# Patient Record
Sex: Female | Born: 1935 | Race: White | Hispanic: No | Marital: Married | Smoking: Never smoker
Health system: Southern US, Community
[De-identification: ages and names within clinical notes are randomized; demographics above are authoritative.]

## PROBLEM LIST (undated history)

## (undated) DIAGNOSIS — C801 Malignant (primary) neoplasm, unspecified: Secondary | ICD-10-CM

## (undated) HISTORY — PX: TUBAL LIGATION: SHX77

## (undated) HISTORY — PX: BLADDER SURGERY: SHX569

## (undated) HISTORY — PX: BREAST SURGERY: SHX581

## (undated) HISTORY — PX: APPENDECTOMY: SHX54

## (undated) HISTORY — PX: CHOLECYSTECTOMY: SHX55

---

## 2010-11-22 ENCOUNTER — Emergency Department (HOSPITAL_BASED_OUTPATIENT_CLINIC_OR_DEPARTMENT_OTHER)
Admission: EM | Admit: 2010-11-22 | Discharge: 2010-11-22 | Payer: Self-pay | Source: Home / Self Care | Admitting: Emergency Medicine

## 2013-06-01 ENCOUNTER — Ambulatory Visit: Payer: Medicare Other | Attending: Oncology | Admitting: Physical Therapy

## 2013-06-01 DIAGNOSIS — Z79899 Other long term (current) drug therapy: Secondary | ICD-10-CM | POA: Insufficient documentation

## 2013-06-01 DIAGNOSIS — C50919 Malignant neoplasm of unspecified site of unspecified female breast: Secondary | ICD-10-CM | POA: Insufficient documentation

## 2013-06-01 DIAGNOSIS — I890 Lymphedema, not elsewhere classified: Secondary | ICD-10-CM | POA: Insufficient documentation

## 2013-06-01 DIAGNOSIS — IMO0001 Reserved for inherently not codable concepts without codable children: Secondary | ICD-10-CM | POA: Insufficient documentation

## 2013-06-01 DIAGNOSIS — Z9010 Acquired absence of unspecified breast and nipple: Secondary | ICD-10-CM | POA: Insufficient documentation

## 2013-06-03 ENCOUNTER — Ambulatory Visit: Payer: Medicare Other

## 2013-06-06 ENCOUNTER — Ambulatory Visit: Payer: Medicare Other

## 2013-06-10 ENCOUNTER — Ambulatory Visit: Payer: Medicare Other

## 2018-02-05 ENCOUNTER — Emergency Department (HOSPITAL_BASED_OUTPATIENT_CLINIC_OR_DEPARTMENT_OTHER): Payer: Medicare Other

## 2018-02-05 ENCOUNTER — Encounter (HOSPITAL_BASED_OUTPATIENT_CLINIC_OR_DEPARTMENT_OTHER): Payer: Self-pay | Admitting: *Deleted

## 2018-02-05 ENCOUNTER — Other Ambulatory Visit: Payer: Self-pay

## 2018-02-05 ENCOUNTER — Emergency Department (HOSPITAL_BASED_OUTPATIENT_CLINIC_OR_DEPARTMENT_OTHER)
Admission: EM | Admit: 2018-02-05 | Discharge: 2018-02-05 | Disposition: A | Discharge status: Home / Self Care | Payer: Medicare Other | Attending: Emergency Medicine | Admitting: Emergency Medicine

## 2018-02-05 DIAGNOSIS — Y929 Unspecified place or not applicable: Secondary | ICD-10-CM | POA: Insufficient documentation

## 2018-02-05 DIAGNOSIS — Z79899 Other long term (current) drug therapy: Secondary | ICD-10-CM | POA: Insufficient documentation

## 2018-02-05 DIAGNOSIS — W19XXXA Unspecified fall, initial encounter: Secondary | ICD-10-CM

## 2018-02-05 DIAGNOSIS — Y9389 Activity, other specified: Secondary | ICD-10-CM | POA: Insufficient documentation

## 2018-02-05 DIAGNOSIS — Y998 Other external cause status: Secondary | ICD-10-CM | POA: Insufficient documentation

## 2018-02-05 DIAGNOSIS — Z859 Personal history of malignant neoplasm, unspecified: Secondary | ICD-10-CM | POA: Insufficient documentation

## 2018-02-05 DIAGNOSIS — Z9049 Acquired absence of other specified parts of digestive tract: Secondary | ICD-10-CM | POA: Insufficient documentation

## 2018-02-05 DIAGNOSIS — S0990XA Unspecified injury of head, initial encounter: Secondary | ICD-10-CM | POA: Insufficient documentation

## 2018-02-05 DIAGNOSIS — W01198A Fall on same level from slipping, tripping and stumbling with subsequent striking against other object, initial encounter: Secondary | ICD-10-CM | POA: Diagnosis not present

## 2018-02-05 HISTORY — DX: Malignant (primary) neoplasm, unspecified: C80.1

## 2018-02-05 MED ORDER — TETANUS-DIPHTH-ACELL PERTUSSIS 5-2.5-18.5 LF-MCG/0.5 IM SUSP: 0.5000 mL | Freq: Once | INTRAMUSCULAR | Status: DC

## 2018-02-05 MED ORDER — ACETAMINOPHEN 500 MG PO TABS: 1000.0000 mg | ORAL_TABLET | Freq: Once | ORAL | Status: DC

## 2018-02-05 NOTE — ED Triage Notes (Signed)
She was giving her dog a bath in the tub and when she stood up she fell backward before getting up. She hit the back of her head on a ceramic wall. No LOC. Large hematoma to the back of her head. She is alert and oriented.

## 2018-02-05 NOTE — ED Notes (Signed)
Patient transported to CT 

## 2018-02-05 NOTE — ED Provider Notes (Addendum)
[facility] DEPART[facility] Provider Note   CSN: [id number] Arrival date & time: 02/05/18  1647     History   Chief Complaint Chief Complaint  Patient presents with  . Fall    HPI Kim Hunt is a 84 y.o. female presenting for evaluation after a fall.   Pt states she was washing her dog when she lost her balance and fell backwards, hitting the back of her head on a cement wall. She denies LOC. No neck or back pain. denies vision changes, slurred speech, decreased concentration, N/V. Denies CP, SOB, abd pain, loss of bowel or bladder control, or numbness or tingling. She is not on blood thinners. She has ambulated without difficulty since the fall. She used ice and advil PTA, did not change her sxs. Was evaluated at urgent care and sent to ER for further evaluation.    HPI  Past Medical History:  Diagnosis Date  . Cancer ([place])     There are no active problems to display for this patient.   Past Surgical History:  Procedure Laterality Date  . APPENDECTOMY    . BLADDER SURGERY    . BREAST SURGERY    . CHOLECYSTECTOMY    . TUBAL LIGATION       OB History   None      Home Medications    Prior to Admission medications   Medication Sig Start Date End Date Taking? Authorizing Provider  anastrozole (ARIMIDEX) 1 MG tablet Take 1 mg by mouth daily.   Yes [provider]  GABAPENTIN PO Take by mouth.   Yes [provider]  VERAPAMIL HCL PO Take by mouth.   Yes [provider]    Family History No family history on file.  Social History Social History   Tobacco Use  . Smoking status: Never Smoker  . Smokeless tobacco: Never Used  Substance Use Topics  . Alcohol use: Never    Frequency: Never  . Drug use: Never     Allergies   Relafen [nabumetone] and Sulfa antibiotics   Review of Systems Review of Systems  HENT:       Occipital head injury  Eyes: Negative for visual disturbance.  Respiratory: Negative  for shortness of breath.   Cardiovascular: Negative for chest pain.  Gastrointestinal: Negative for abdominal pain, nausea and vomiting.  Genitourinary:       No loss of bowel or bladder  Musculoskeletal: Negative for back pain and neck pain.  Skin: Positive for wound.  Neurological: Negative for dizziness, light-headedness and numbness.  Hematological: Does not bruise/bleed easily.  Psychiatric/Behavioral: Negative for confusion.     Physical Exam Updated Vital Signs BP 133/60   Pulse 66   Temp 97.9 F (36.6 C) (Oral)   Resp 16   Ht 5\' 2"  (1.575 m)   Wt 77.1 kg (170 lb)   SpO2 97%   BMI 31.09 kg/m   Physical Exam  Constitutional: She is oriented to person, place, and time. She appears well-developed and well-nourished. No distress.  HENT:  Head: Normocephalic.  Right Ear: Tympanic membrane, external ear and ear canal normal.  Left Ear: Tympanic membrane, external ear and ear canal normal.  Nose: Nose normal.  Mouth/Throat: Uvula is midline, oropharynx is clear and moist and mucous membranes are normal.  Hematoma on occiput of head without bleeding. No malocclusion. No TTP elsewhere of the head or scalp.  Eyes: Pupils are equal, round, and reactive to light. EOM are normal.  Neck:  Normal range of motion. Neck supple.  Full ROM of head and neck without pain. No TTP of midline c-spine   Cardiovascular: Normal rate, regular rhythm and intact distal pulses.  Pulmonary/Chest: Effort normal and breath sounds normal. She exhibits no tenderness.  No TTP of the chest wall  Abdominal: Soft. She exhibits no distension. There is no tenderness.  No TTP of the abd.  Musculoskeletal: She exhibits no tenderness.  No TTP of the back or extremities. Strength intact x4, sensation intact x4. Radial and pedal pulses equal bilaterally. Pt ambulatory without difficulty.   Neurological: She is alert and oriented to person, place, and time. She has normal strength. No cranial nerve deficit or  sensory deficit. GCS eye subscore is 4. GCS verbal subscore is 5. GCS motor subscore is 6.  Fine movement and coordination intact. No obvious neuro deficit  Skin: Skin is warm.  Psychiatric: She has a normal mood and affect.  Nursing note and vitals reviewed.    ED Treatments / Results  Labs (all labs ordered are listed, but only abnormal results are displayed) Labs Reviewed - No data to display  EKG None  Radiology Ct Head Wo Contrast  Result Date: 02/05/2018 CLINICAL DATA:  Patient fell backwards hitting posterior head on wall. EXAM: CT HEAD WITHOUT CONTRAST CT CERVICAL SPINE WITHOUT CONTRAST TECHNIQUE: Multidetector CT imaging of the head and cervical spine was performed following the standard protocol without intravenous contrast. Multiplanar CT image reconstructions of the cervical spine were also generated. COMPARISON:  None. Head CT 11/22/2010, radiograph report of the cervical spine from 08/29/1995 FINDINGS: CT HEAD FINDINGS Brain: Atrophy with chronic small vessel ischemic disease. No acute intracranial hemorrhage, midline shift or edema. No hydrocephalus. Midline fourth ventricle and basal cisterns. No intra-axial mass nor extra-axial fluid collections. Vascular: No hyperdense vessel sign. Moderate atherosclerosis of the carotid siphons bilaterally. Skull: No acute skull fracture. Sinuses/Orbits: No acute finding. Other: Right posterior parietal scalp contusion. CT CERVICAL SPINE FINDINGS Alignment: Intact craniocervical relationship and atlantodental interval. Maintained cervical lordosis. Skull base and vertebrae: No acute fracture. No primary bone lesion or focal pathologic process. Osteoarthritis across the atlantodental interval with spurring. Osteoarthritis of the C4-5, C5-6 and C6-7 uncovertebral joints with minimal uncinate spurring. Soft tissues and spinal canal: No prevertebral fluid or swelling. No visible canal hematoma. Disc levels: Moderate disc space narrowing from C4  through T2 with small dorsal osteophytes from C4 through C7. No significant central canal stenosis or neural foraminal encroachment. Upper chest: Negative. Other: None IMPRESSION: 1. Right posterior parietal scalp contusion without underlying skull fracture. 2. Atrophy with chronic small vessel ischemia. No acute intracranial abnormality. 3. No acute cervical spine fracture or posttraumatic listhesis. Chronic degenerative disc disease C4 through T2. Electronically Signed   By: Ashley Royalty M.D.   On: 02/05/2018 18:10   Ct Cervical Spine Wo Contrast  Result Date: 02/05/2018 CLINICAL DATA:  Patient fell backwards hitting posterior head on wall. EXAM: CT HEAD WITHOUT CONTRAST CT CERVICAL SPINE WITHOUT CONTRAST TECHNIQUE: Multidetector CT imaging of the head and cervical spine was performed following the standard protocol without intravenous contrast. Multiplanar CT image reconstructions of the cervical spine were also generated. COMPARISON:  None. Head CT 11/22/2010, radiograph report of the cervical spine from 08/29/1995 FINDINGS: CT HEAD FINDINGS Brain: Atrophy with chronic small vessel ischemic disease. No acute intracranial hemorrhage, midline shift or edema. No hydrocephalus. Midline fourth ventricle and basal cisterns. No intra-axial mass nor extra-axial fluid collections. Vascular: No hyperdense vessel sign. Moderate atherosclerosis  of the carotid siphons bilaterally. Skull: No acute skull fracture. Sinuses/Orbits: No acute finding. Other: Right posterior parietal scalp contusion. CT CERVICAL SPINE FINDINGS Alignment: Intact craniocervical relationship and atlantodental interval. Maintained cervical lordosis. Skull base and vertebrae: No acute fracture. No primary bone lesion or focal pathologic process. Osteoarthritis across the atlantodental interval with spurring. Osteoarthritis of the C4-5, C5-6 and C6-7 uncovertebral joints with minimal uncinate spurring. Soft tissues and spinal canal: No prevertebral  fluid or swelling. No visible canal hematoma. Disc levels: Moderate disc space narrowing from C4 through T2 with small dorsal osteophytes from C4 through C7. No significant central canal stenosis or neural foraminal encroachment. Upper chest: Negative. Other: None IMPRESSION: 1. Right posterior parietal scalp contusion without underlying skull fracture. 2. Atrophy with chronic small vessel ischemia. No acute intracranial abnormality. 3. No acute cervical spine fracture or posttraumatic listhesis. Chronic degenerative disc disease C4 through T2. Electronically Signed   By: Ashley Royalty M.D.   On: 02/05/2018 18:10    Procedures Procedures (including critical care time)  Medications Ordered in ED Medications  acetaminophen (TYLENOL) tablet 1,000 mg (1,000 mg Oral Not Given 02/05/18 1727)     Initial Impression / Assessment and Plan / ED Course  I have reviewed the triage vital signs and the nursing notes.  Pertinent labs & imaging results that were available during my care of the patient were reviewed by me and considered in my medical decision making (see chart for details).     Pt presenting for evaluation after a fall. Head injury without bleeding or LOC. Physical exam shows no gross neurologic deficits. Will obtain ct head and neck for further eval.   Ct head and neck negative for bleed, fx, or other concerning acute pathology. Discussed findings with pt. Discussed tylenol, nsaid, and ice use for sx control. Follow up with pcp as needed. At this time, pt appears safe for d/c. Return precautions given. Pt states she understands and agrees to plan.   Final Clinical Impressions(s) / ED Diagnoses   Final diagnoses:  Fall, initial encounter  Injury of head, initial encounter    ED Discharge Orders    None       Franchot Heidelberg, PA-C 02/05/18 [place], Yuridiana Formanek, PA-C 02/05/18 [place], [place], DO 02/05/18 [id number]

## 2018-02-05 NOTE — Discharge Instructions (Signed)
Take tylenol and ibuprofen as needed for pain.  Continue to use ice on your head.  Follow up with your primary care doctor as needed.  Return to the ER if you develop vision changes, slurred speech, vomiting, imbalance, severe headache, or any new or concerning symptoms.

## 2018-02-06 ENCOUNTER — Emergency Department (HOSPITAL_BASED_OUTPATIENT_CLINIC_OR_DEPARTMENT_OTHER): Payer: Medicare Other

## 2018-02-06 ENCOUNTER — Other Ambulatory Visit: Payer: Self-pay

## 2018-02-06 ENCOUNTER — Observation Stay (HOSPITAL_BASED_OUTPATIENT_CLINIC_OR_DEPARTMENT_OTHER)
Admission: EM | Admit: 2018-02-06 | Discharge: 2018-02-07 | Disposition: A | Discharge status: Home / Self Care | Payer: Medicare Other | Attending: Internal Medicine | Admitting: Internal Medicine

## 2018-02-06 ENCOUNTER — Encounter (HOSPITAL_BASED_OUTPATIENT_CLINIC_OR_DEPARTMENT_OTHER): Payer: Self-pay | Admitting: Emergency Medicine

## 2018-02-06 DIAGNOSIS — S066X0A Traumatic subarachnoid hemorrhage without loss of consciousness, initial encounter: Secondary | ICD-10-CM | POA: Diagnosis not present

## 2018-02-06 DIAGNOSIS — S065XAA Traumatic subdural hemorrhage with loss of consciousness status unknown, initial encounter: Secondary | ICD-10-CM | POA: Diagnosis present

## 2018-02-06 DIAGNOSIS — Y9389 Activity, other specified: Secondary | ICD-10-CM | POA: Insufficient documentation

## 2018-02-06 DIAGNOSIS — S065X0A Traumatic subdural hemorrhage without loss of consciousness, initial encounter: Secondary | ICD-10-CM | POA: Diagnosis not present

## 2018-02-06 DIAGNOSIS — Y999 Unspecified external cause status: Secondary | ICD-10-CM | POA: Insufficient documentation

## 2018-02-06 DIAGNOSIS — S065X9A Traumatic subdural hemorrhage with loss of consciousness of unspecified duration, initial encounter: Secondary | ICD-10-CM | POA: Diagnosis present

## 2018-02-06 DIAGNOSIS — I10 Essential (primary) hypertension: Secondary | ICD-10-CM | POA: Insufficient documentation

## 2018-02-06 DIAGNOSIS — S098XXA Other specified injuries of head, initial encounter: Secondary | ICD-10-CM | POA: Diagnosis present

## 2018-02-06 DIAGNOSIS — Z853 Personal history of malignant neoplasm of breast: Secondary | ICD-10-CM | POA: Insufficient documentation

## 2018-02-06 DIAGNOSIS — W01198A Fall on same level from slipping, tripping and stumbling with subsequent striking against other object, initial encounter: Secondary | ICD-10-CM | POA: Insufficient documentation

## 2018-02-06 DIAGNOSIS — S0990XA Unspecified injury of head, initial encounter: Secondary | ICD-10-CM

## 2018-02-06 DIAGNOSIS — Z9181 History of falling: Secondary | ICD-10-CM | POA: Diagnosis not present

## 2018-02-06 DIAGNOSIS — Y92000 Kitchen of unspecified non-institutional (private) residence as  the place of occurrence of the external cause: Secondary | ICD-10-CM | POA: Insufficient documentation

## 2018-02-06 LAB — CBC WITH DIFFERENTIAL/PLATELET
BASOS ABS: 0 10*3/uL (ref 0.0–0.1)
BASOS PCT: 0 %
Eosinophils Absolute: 0.1 10*3/uL (ref 0.0–0.7)
Eosinophils Relative: 2 %
HEMATOCRIT: 37.2 % (ref 36.0–46.0)
HEMOGLOBIN: 12.4 g/dL (ref 12.0–15.0)
Lymphocytes Relative: 14 %
Lymphs Abs: 1 10*3/uL (ref 0.7–4.0)
MCH: 31.6 pg (ref 26.0–34.0)
MCHC: 33.3 g/dL (ref 30.0–36.0)
MCV: 94.9 fL (ref 78.0–100.0)
MONO ABS: 0.5 10*3/uL (ref 0.1–1.0)
Monocytes Relative: 6 %
NEUTROS PCT: 78 %
Neutro Abs: 6 10*3/uL (ref 1.7–7.7)
Platelets: 213 10*3/uL (ref 150–400)
RBC: 3.92 MIL/uL (ref 3.87–5.11)
RDW: 12.5 % (ref 11.5–15.5)
WBC: 7.7 10*3/uL (ref 4.0–10.5)

## 2018-02-06 LAB — BASIC METABOLIC PANEL
Anion gap: 9 (ref 5–15)
BUN: 19 mg/dL (ref 6–20)
CALCIUM: 9.4 mg/dL (ref 8.9–10.3)
CO2: 23 mmol/L (ref 22–32)
Chloride: 107 mmol/L (ref 101–111)
Creatinine, Ser: 0.8 mg/dL (ref 0.44–1.00)
Glucose, Bld: 93 mg/dL (ref 65–99)
Potassium: 4.1 mmol/L (ref 3.5–5.1)
Sodium: 139 mmol/L (ref 135–145)

## 2018-02-06 LAB — PROTIME-INR
INR: 1
PROTHROMBIN TIME: 13.1 s (ref 11.4–15.2)

## 2018-02-06 MED ORDER — ACETAMINOPHEN 325 MG PO TABS
650.0000 mg | ORAL_TABLET | ORAL | Status: DC | PRN
  Administered 2018-02-06 – 2018-02-07 (×2): 650 mg via ORAL

## 2018-02-06 MED ORDER — SENNOSIDES-DOCUSATE SODIUM 8.6-50 MG PO TABS
1.0000 | ORAL_TABLET | Freq: Two times a day (BID) | ORAL | Status: DC
  Administered 2018-02-06: 1 via ORAL
  Filled 2018-02-06: qty 1

## 2018-02-06 MED ORDER — PANTOPRAZOLE SODIUM 40 MG IV SOLR
40.0000 mg | Freq: Every day | INTRAVENOUS | Status: DC
  Administered 2018-02-06: 40 mg via INTRAVENOUS
  Filled 2018-02-06: qty 40

## 2018-02-06 MED ORDER — ACETAMINOPHEN 160 MG/5ML PO SOLN: 650.0000 mg | ORAL | Status: DC | PRN

## 2018-02-06 MED ORDER — METOPROLOL TARTRATE 5 MG/5ML IV SOLN: 5.0000 mg | INTRAVENOUS | Status: DC | PRN

## 2018-02-06 MED ORDER — ACETAMINOPHEN 325 MG PO TABS
650.0000 mg | ORAL_TABLET | ORAL | Status: DC | PRN
  Filled 2018-02-06 (×3): qty 2

## 2018-02-06 MED ORDER — ACETAMINOPHEN 650 MG RE SUPP: 650.0000 mg | RECTAL | Status: DC | PRN

## 2018-02-06 MED ORDER — ACETAMINOPHEN 500 MG PO TABS
1000.0000 mg | ORAL_TABLET | Freq: Once | ORAL | Status: AC
  Administered 2018-02-06: 1000 mg via ORAL
  Filled 2018-02-06: qty 2

## 2018-02-06 MED ORDER — STROKE: EARLY STAGES OF RECOVERY BOOK
Freq: Once | Status: AC
  Administered 2018-02-06: 22:00:00

## 2018-02-06 NOTE — ED Notes (Signed)
ED Provider at bedside. 

## 2018-02-06 NOTE — ED Notes (Signed)
Tetanus UTD

## 2018-02-06 NOTE — ED Triage Notes (Signed)
Pt reports falling this morning when trying to get up from the chair. Pt hit head on side of the cabinet. Bleeding noted to outside of R ear. Denies LOC. Pt also reports falling yesterday.

## 2018-02-06 NOTE — ED Notes (Signed)
Patient transported to CT 

## 2018-02-06 NOTE — Progress Notes (Signed)
Patient has arrived in her room via [facility] transport.

## 2018-02-06 NOTE — ED Provider Notes (Addendum)
[facility] Provider Note   CSN: [id number] Arrival date & time: 02/06/18  1153     History   Chief Complaint Chief Complaint  Patient presents with  . Fall    HPI Kim Hunt is a 84 y.o. female.  HPI She was standing on a step stool in her kitchen and lost her balance.  She fell hitting her right side of her head on the corner of her cabinet.  She denies that she got knocked out.  She did go down to the floor.  She apparently landed on her left side.  Patient reports that she was able to get up.  She has been ambulatory.  She denies neck pain.  She denies focal weakness numbness tingling.  She does report however she has a headache on the left side of her head.  She is not on any anticoagulants.  She was concerned because she actually fell day before yesterday while trying to wash her dog in the bathroom and struck her head.  She was seen in the emergency department and CT scan was done that was negative.  She did have posterior scalp hematoma. Past Medical History:  Diagnosis Date  . Cancer ([place])     There are no active problems to display for this patient.   Past Surgical History:  Procedure Laterality Date  . APPENDECTOMY    . BLADDER SURGERY    . BREAST SURGERY    . CHOLECYSTECTOMY    . TUBAL LIGATION       OB History   None      Home Medications    Prior to Admission medications   Medication Sig Start Date End Date Taking? Authorizing Provider  anastrozole (ARIMIDEX) 1 MG tablet Take 1 mg by mouth daily.    [provider]  GABAPENTIN PO Take by mouth.    [provider]  VERAPAMIL HCL PO Take by mouth.    [provider]    Family History No family history on file.  Social History Social History   Tobacco Use  . Smoking status: Never Smoker  . Smokeless tobacco: Never Used  Substance Use Topics  . Alcohol use: Never    Frequency: Never  . Drug use: Never     Allergies     Relafen [nabumetone] and Sulfa antibiotics   Review of Systems Review of Systems 10 Systems reviewed and are negative for acute change except as noted in the HPI.   Physical Exam Updated Vital Signs BP 123/68   Pulse 73   Temp 98 F (36.7 C)   Resp 18   Ht 5\' 2"  (1.575 m)   Wt 77.1 kg (170 lb)   SpO2 93%   BMI 31.09 kg/m   Physical Exam  Constitutional: She is oriented to person, place, and time. She appears well-developed and well-nourished. No distress.  HENT:  Head: Normocephalic and atraumatic.  Nose: Nose normal.  Mouth/Throat: Oropharynx is clear and moist.  Patient has a linear laceration to the pinna that remains closed.  Does have slight amount of bleeding.  This is approximately 3 mm with no gaping.  She does have some subtle bruising within the pinna.  The TM is normal.  No blood in the ear canal.  Patient does have hematoma from yesterday.  This is a posterior scalp approximately 4 cm.  No bleeding or overlying laceration.  She reports there was a slight amount of bleeding yesterday.  Today this is clean  and dry with no active skin wound.  Eyes: Pupils are equal, round, and reactive to light. Conjunctivae and EOM are normal.  Neck: Neck supple.  No C-spine tenderness.  Cardiovascular: Normal rate and regular rhythm.  Murmur heard. Pulmonary/Chest: Effort normal and breath sounds normal. No respiratory distress. She exhibits no tenderness.  Abdominal: Soft. She exhibits no distension. There is no tenderness. There is no guarding.  Musculoskeletal: Normal range of motion. She exhibits no edema or tenderness.  A 4 cm bruise to the right anterior shin.  Normal range of motion of both extremities.  Normal range of motion of upper extremities.  No deformities.  Neurological: She is alert and oriented to person, place, and time. No cranial nerve deficit or sensory deficit. She exhibits normal muscle tone. Coordination normal.  Skin: Skin is warm and dry.  Psychiatric:  She has a normal mood and affect.  Nursing note and vitals reviewed.    ED Treatments / Results  Labs (all labs ordered are listed, but only abnormal results are displayed) Labs Reviewed  BASIC METABOLIC PANEL  CBC WITH DIFFERENTIAL/PLATELET  PROTIME-INR    EKG None  Radiology Ct Head Wo Contrast  Result Date: 02/06/2018 CLINICAL DATA:  [place] yesterday striking back of head, fell again today while climbing on a stool, slipped and struck RIGHT side of head on counter, RIGHT ear laceration, neck pain radiating to LEFT shoulder and arm, denies loss of consciousness, having headache and some dizziness EXAM: CT HEAD WITHOUT CONTRAST CT CERVICAL SPINE WITHOUT CONTRAST TECHNIQUE: Multidetector CT imaging of the head and cervical spine was performed following the standard protocol without intravenous contrast. Multiplanar CT image reconstructions of the cervical spine were also generated. Sagittal and coronal MPR images reconstructed from axial data set. COMPARISON:  02/05/2018 FINDINGS: CT HEAD FINDINGS Brain: Generalized atrophy. Normal ventricular morphology. No midline shift or mass effect. Minimal small vessel chronic ischemic changes of deep cerebral white matter. New small focus of subarachnoid hemorrhage in the LEFT frontal region 9 mm diameter. Probable small amount of subarachnoid hemorrhage in the LEFT sylvian fissure. Tiny subdural hematoma along the LEFT lateral aspect of the anterior falx 1-2 mm thick. No additional intracranial hemorrhage, mass lesion or evidence of acute infarction. Vascular: No hyperdense vessels. Atherosclerotic calcifications of internal carotid and vertebral arteries at skull base Skull: Posterior scalp hematoma. Bones demineralized. No calvarial fractures. Mild hyperostosis frontalis interna on RIGHT. Sinuses/Orbits: Clear Other: N/A CT CERVICAL SPINE FINDINGS Alignment: Normal Skull base and vertebrae: Osseous demineralization. Visualized skull base intact. Mild  scattered facet degenerative changes of the cervical spine. Scattered disc space narrowing and endplate spur formation at C4-C5 to T1-T2. Vertebral body heights maintained. No acute fracture, subluxation, or bone destruction. Soft tissues and spinal canal: Prevertebral soft tissues normal thickness. Regional cervical soft tissues unremarkable. Disc levels:  No additional significant findings Upper chest: Visualized lung apices clear. Other: N/A IMPRESSION: Tiny acute subdural hematoma along the LEFT lateral aspect of the anterior falx 1-2 mm thick. Small focus of acute subarachnoid hemorrhage at LEFT frontal region. Probable small amount of subarachnoid blood within the LEFT sylvian fissure. No acute parenchymal brain abnormalities. Atrophy with small vessel chronic ischemic changes of deep cerebral white matter. Degenerative disc and facet disease changes of the cervical spine. No acute cervical spine abnormalities. Critical Value/emergent results were called by telephone at the time of interpretation on 02/06/2018 at 2:01 pm to Dr. Charlesetta Shanks , who verbally acknowledged these results. Electronically Signed   By: Elta [place]  Thornton Papas M.D.   On: 02/06/2018 14:03   Ct Head Wo Contrast  Result Date: 02/05/2018 CLINICAL DATA:  Patient fell backwards hitting posterior head on wall. EXAM: CT HEAD WITHOUT CONTRAST CT CERVICAL SPINE WITHOUT CONTRAST TECHNIQUE: Multidetector CT imaging of the head and cervical spine was performed following the standard protocol without intravenous contrast. Multiplanar CT image reconstructions of the cervical spine were also generated. COMPARISON:  None. Head CT 11/22/2010, radiograph report of the cervical spine from 08/29/1995 FINDINGS: CT HEAD FINDINGS Brain: Atrophy with chronic small vessel ischemic disease. No acute intracranial hemorrhage, midline shift or edema. No hydrocephalus. Midline fourth ventricle and basal cisterns. No intra-axial mass nor extra-axial fluid collections.  Vascular: No hyperdense vessel sign. Moderate atherosclerosis of the carotid siphons bilaterally. Skull: No acute skull fracture. Sinuses/Orbits: No acute finding. Other: Right posterior parietal scalp contusion. CT CERVICAL SPINE FINDINGS Alignment: Intact craniocervical relationship and atlantodental interval. Maintained cervical lordosis. Skull base and vertebrae: No acute fracture. No primary bone lesion or focal pathologic process. Osteoarthritis across the atlantodental interval with spurring. Osteoarthritis of the C4-5, C5-6 and C6-7 uncovertebral joints with minimal uncinate spurring. Soft tissues and spinal canal: No prevertebral fluid or swelling. No visible canal hematoma. Disc levels: Moderate disc space narrowing from C4 through T2 with small dorsal osteophytes from C4 through C7. No significant central canal stenosis or neural foraminal encroachment. Upper chest: Negative. Other: None IMPRESSION: 1. Right posterior parietal scalp contusion without underlying skull fracture. 2. Atrophy with chronic small vessel ischemia. No acute intracranial abnormality. 3. No acute cervical spine fracture or posttraumatic listhesis. Chronic degenerative disc disease C4 through T2. Electronically Signed   By: Ashley Royalty M.D.   On: 02/05/2018 18:10   Ct Cervical Spine Wo Contrast  Result Date: 02/06/2018 CLINICAL DATA:  [place] yesterday striking back of head, fell again today while climbing on a stool, slipped and struck RIGHT side of head on counter, RIGHT ear laceration, neck pain radiating to LEFT shoulder and arm, denies loss of consciousness, having headache and some dizziness EXAM: CT HEAD WITHOUT CONTRAST CT CERVICAL SPINE WITHOUT CONTRAST TECHNIQUE: Multidetector CT imaging of the head and cervical spine was performed following the standard protocol without intravenous contrast. Multiplanar CT image reconstructions of the cervical spine were also generated. Sagittal and coronal MPR images reconstructed from  axial data set. COMPARISON:  02/05/2018 FINDINGS: CT HEAD FINDINGS Brain: Generalized atrophy. Normal ventricular morphology. No midline shift or mass effect. Minimal small vessel chronic ischemic changes of deep cerebral white matter. New small focus of subarachnoid hemorrhage in the LEFT frontal region 9 mm diameter. Probable small amount of subarachnoid hemorrhage in the LEFT sylvian fissure. Tiny subdural hematoma along the LEFT lateral aspect of the anterior falx 1-2 mm thick. No additional intracranial hemorrhage, mass lesion or evidence of acute infarction. Vascular: No hyperdense vessels. Atherosclerotic calcifications of internal carotid and vertebral arteries at skull base Skull: Posterior scalp hematoma. Bones demineralized. No calvarial fractures. Mild hyperostosis frontalis interna on RIGHT. Sinuses/Orbits: Clear Other: N/A CT CERVICAL SPINE FINDINGS Alignment: Normal Skull base and vertebrae: Osseous demineralization. Visualized skull base intact. Mild scattered facet degenerative changes of the cervical spine. Scattered disc space narrowing and endplate spur formation at C4-C5 to T1-T2. Vertebral body heights maintained. No acute fracture, subluxation, or bone destruction. Soft tissues and spinal canal: Prevertebral soft tissues normal thickness. Regional cervical soft tissues unremarkable. Disc levels:  No additional significant findings Upper chest: Visualized lung apices clear. Other: N/A IMPRESSION: Tiny acute subdural hematoma along the  LEFT lateral aspect of the anterior falx 1-2 mm thick. Small focus of acute subarachnoid hemorrhage at LEFT frontal region. Probable small amount of subarachnoid blood within the LEFT sylvian fissure. No acute parenchymal brain abnormalities. Atrophy with small vessel chronic ischemic changes of deep cerebral white matter. Degenerative disc and facet disease changes of the cervical spine. No acute cervical spine abnormalities. Critical Value/emergent results were  called by telephone at the time of interpretation on 02/06/2018 at 2:01 pm to Dr. Charlesetta Shanks , who verbally acknowledged these results. Electronically Signed   By: Lavonia Dana M.D.   On: 02/06/2018 14:03   Ct Cervical Spine Wo Contrast  Result Date: 02/05/2018 CLINICAL DATA:  Patient fell backwards hitting posterior head on wall. EXAM: CT HEAD WITHOUT CONTRAST CT CERVICAL SPINE WITHOUT CONTRAST TECHNIQUE: Multidetector CT imaging of the head and cervical spine was performed following the standard protocol without intravenous contrast. Multiplanar CT image reconstructions of the cervical spine were also generated. COMPARISON:  None. Head CT 11/22/2010, radiograph report of the cervical spine from 08/29/1995 FINDINGS: CT HEAD FINDINGS Brain: Atrophy with chronic small vessel ischemic disease. No acute intracranial hemorrhage, midline shift or edema. No hydrocephalus. Midline fourth ventricle and basal cisterns. No intra-axial mass nor extra-axial fluid collections. Vascular: No hyperdense vessel sign. Moderate atherosclerosis of the carotid siphons bilaterally. Skull: No acute skull fracture. Sinuses/Orbits: No acute finding. Other: Right posterior parietal scalp contusion. CT CERVICAL SPINE FINDINGS Alignment: Intact craniocervical relationship and atlantodental interval. Maintained cervical lordosis. Skull base and vertebrae: No acute fracture. No primary bone lesion or focal pathologic process. Osteoarthritis across the atlantodental interval with spurring. Osteoarthritis of the C4-5, C5-6 and C6-7 uncovertebral joints with minimal uncinate spurring. Soft tissues and spinal canal: No prevertebral fluid or swelling. No visible canal hematoma. Disc levels: Moderate disc space narrowing from C4 through T2 with small dorsal osteophytes from C4 through C7. No significant central canal stenosis or neural foraminal encroachment. Upper chest: Negative. Other: None IMPRESSION: 1. Right posterior parietal scalp  contusion without underlying skull fracture. 2. Atrophy with chronic small vessel ischemia. No acute intracranial abnormality. 3. No acute cervical spine fracture or posttraumatic listhesis. Chronic degenerative disc disease C4 through T2. Electronically Signed   By: Ashley Royalty M.D.   On: 02/05/2018 18:10    Procedures Procedures (including critical care time)  Medications Ordered in ED Medications  acetaminophen (TYLENOL) tablet 1,000 mg (1,000 mg Oral Given 02/06/18 1323)     Initial Impression / Assessment and Plan / ED Course  I have reviewed the triage vital signs and the nursing notes.  Pertinent labs & imaging results that were available during my care of the patient were reviewed by me and considered in my medical decision making (see chart for details).    Consult: Discussed with Dr. Christella Noa.  Will admit to hospitalist service for observation.  Patient's management at this time is nonoperative. Consult: [facility] hospitalist Dr. Lonn Georgia for admission.  Final Clinical Impressions(s) / ED Diagnoses   Final diagnoses:  Injury of head, initial encounter  Subdural hematoma (H[facility])  Subarachnoid hematoma, without loss of consciousness, initial encounter [facility])   Patient had mechanical fall today.  Her clinical appearance is very good.  She is alert and appropriate.  Mental status is normal.  She did have left-sided headache.  CT confirms a very small subarachnoid and sub-dural hemorrhages.  I did review this with Dr. Christella Noa.  Findings are nonoperative but does agree with an observation.  Serial neuro testing and repeat  CT if any deterioration in patient's condition.  She also had a very minor laceration to the ear pinna with slight ecchymosis suggestive of early pinna hematoma.  There is no significant swelling that was suggest need to drain.  Will apply a pressure dressing.  Arrangements have been made for admission.  Patient does request attempt to admit to [facility] will  be made to determine if there is bed availability.  16:18 viewed with Dr. Doyne Keel at [facility].  He reports there are no beds available and they also do not have continupus neurosurgery coverage for a traumatic injury. ED Discharge Orders    None       Charlesetta Shanks, MD 02/06/18 1612    Charlesetta Shanks, MD 02/06/18 [phone]

## 2018-02-06 NOTE — ED Notes (Signed)
MD at bedside explaining results to patient.

## 2018-02-06 NOTE — Progress Notes (Signed)
Patient ID: Kim Hunt, female   DOB: 04-29-1931, 84 y.o.   MRN: [id number] Films are reviewed. There is no operative indication. There is a miniscule amount of blood on these films, there is no skull fracture, basal cisterns are widely patent.  No repeat film necessary unless her neurological exam declines.

## 2018-02-06 NOTE — H&P (Signed)
PCP:   [facility]   Chief Complaint:  fall  HPI: [name] who presents after 2 falls in 2 days,  Both times she hit her head..  They were mechanical falls. Yesterdays fall occurred after she was bathing her dog. She's  had 2 prior knee surgeries, her knee gave out and she failed to hold on to the bath tub. She fell backwards and hit the right side of her head. She went to the ER, w/u including CT head was negative and she was sent home.  This AM she was climbing on a chair to get something off the top of her refrigerator. She fell and hit the right side of her face. She had head and neck ain. She returned to the ER where repeat Ct head revealed 2 small SDH. She was transferred to [facility] for observation. The patient is not on any blood thinners. She did take an aduil yesterday for pain. Here she is alert and oriented. She was able to provide all history. She is accompanied by her daughter and husband at bedside. She denies dizziness, nausea, vomiting or confusion.   Review of Systems:  The patient denies anorexia, fever, weight loss,, vision loss, decreased hearing, hoarseness, chest pain, syncope, dyspnea on exertion, peripheral edema, balance deficits, headache, fall, hemoptysis, abdominal pain, melena, hematochezia, severe indigestion/heartburn, hematuria, incontinence, genital sores, muscle weakness, suspicious skin lesions, transient blindness, difficulty walking, depression, unusual weight change, abnormal bleeding, enlarged lymph nodes, angioedema, and breast masses.  Past Medical History: Past Medical History:  Diagnosis Date  . Cancer [facility])    Past Surgical History:  Procedure Laterality Date  . APPENDECTOMY    . BLADDER SURGERY    . BREAST SURGERY    . CHOLECYSTECTOMY    . TUBAL LIGATION      Medications: Prior to Admission medications   Medication Sig Start Date End Date Taking? Authorizing Provider  anastrozole (ARIMIDEX) 1 MG tablet Take 1 mg by mouth  at bedtime.    Yes [provider]  ciclopirox (PENLAC) 8 % solution Apply 1 application topically See admin instructions. Apply over toe nail and surrounding skin. Apply daily over previous coat. After seven (7) days,  remove with nail polish remover and continue cycle.   Yes [provider]  fluticasone (FLONASE) 50 MCG/ACT nasal spray Place 1 spray into both nostrils 2 (two) times daily as needed for allergies or rhinitis.   Yes [provider]  gabapentin (NEURONTIN) 300 MG capsule Take 300 mg by mouth 2 (two) times daily as needed (pain).   Yes [provider]  ibuprofen (ADVIL,MOTRIN) 200 MG tablet Take 200 mg by mouth every 6 (six) hours as needed for headache (pain).   Yes [provider]  verapamil (CALAN-SR) 240 MG CR tablet Take 240 mg by mouth daily.   Yes [provider]  Vitamin D, Ergocalciferol, (DRISDOL) 50000 units CAPS capsule Take 50,000 Units by mouth every Sunday.   Yes [provider]    Allergies:   Allergies  Allergen Reactions  . Clarithromycin Other (See Comments)    Unknown reaction  . Relafen [Nabumetone] Other (See Comments)    Unknown reaction  . Sulfa Antibiotics Nausea And Vomiting    Social History:  reports that she has never smoked. She has never used smokeless tobacco. She reports that she does not drink alcohol or use drugs.  Family History: Cancer - dad  Physical Exam: Vitals:   02/06/18 1700 02/06/18  1715 02/06/18 1730 02/06/18 1851  BP: 127/65  134/67 (!) 148/67  Pulse: 73 75 74 80  Resp: 16 17 15 16   Temp:    98.2 F (36.8 C)  TempSrc:    Oral  SpO2: 96% 98% 98% 100%  Weight:      Height:        General:  Alert and oriented times three, well developed and nourished, no acute distress Eyes: PERRLA, pink conjunctiva, no scleral icterus ENT: Moist oral mucosa, neck supple, no thyromegaly Lungs: clear to ascultation, no wheeze, no crackles, no use of accessory  muscles Cardiovascular: regular rate and rhythm, no regurgitation, no gallops, no murmurs. No carotid bruits, no JVD Abdomen: soft, positive BS, non-tender, non-distended, no organomegaly, not an acute abdomen GU: not examined Neuro: CN II - XII grossly intact, sensation intact Musculoskeletal: strength 5/5 all extremities, no clubbing, cyanosis or edema Skin: no rash, no subcutaneous crepitation, no decubitus Psych: appropriate patient   Labs on Admission:  Recent Labs    02/06/18 1423  NA 139  K 4.1  CL 107  CO2 23  GLUCOSE 93  BUN 19  CREATININE 0.80  CALCIUM 9.4   No results for input(s): AST, ALT, ALKPHOS, BILITOT, PROT, ALBUMIN in the last 72 hours. No results for input(s): LIPASE, AMYLASE in the last 72 hours. Recent Labs    02/06/18 1423  WBC 7.7  NEUTROABS 6.0  HGB 12.4  HCT 37.2  MCV 94.9  PLT 213   No results for input(s): CKTOTAL, CKMB, CKMBINDEX, TROPONINI in the last 72 hours. Invalid input(s): POCBNP No results for input(s): DDIMER in the last 72 hours. No results for input(s): HGBA1C in the last 72 hours. No results for input(s): CHOL, HDL, LDLCALC, TRIG, CHOLHDL, LDLDIRECT in the last 72 hours. No results for input(s): TSH, T4TOTAL, T3FREE, THYROIDAB in the last 72 hours.  Invalid input(s): FREET3 No results for input(s): VITAMINB12, FOLATE, FERRITIN, TIBC, IRON, RETICCTPCT in the last 72 hours.  Micro Results: No results found for this or any previous visit (from the past 240 hour(s)).   Radiological Exams on Admission: Ct Head Wo Contrast  Result Date: 02/06/2018 CLINICAL DATA:  [place] yesterday striking back of head, fell again today while climbing on a stool, slipped and struck RIGHT side of head on counter, RIGHT ear laceration, neck pain radiating to LEFT shoulder and arm, denies loss of consciousness, having headache and some dizziness EXAM: CT HEAD WITHOUT CONTRAST CT CERVICAL SPINE WITHOUT CONTRAST TECHNIQUE: Multidetector CT imaging of the  head and cervical spine was performed following the standard protocol without intravenous contrast. Multiplanar CT image reconstructions of the cervical spine were also generated. Sagittal and coronal MPR images reconstructed from axial data set. COMPARISON:  02/05/2018 FINDINGS: CT HEAD FINDINGS Brain: Generalized atrophy. Normal ventricular morphology. No midline shift or mass effect. Minimal small vessel chronic ischemic changes of deep cerebral white matter. New small focus of subarachnoid hemorrhage in the LEFT frontal region 9 mm diameter. Probable small amount of subarachnoid hemorrhage in the LEFT sylvian fissure. Tiny subdural hematoma along the LEFT lateral aspect of the anterior falx 1-2 mm thick. No additional intracranial hemorrhage, mass lesion or evidence of acute infarction. Vascular: No hyperdense vessels. Atherosclerotic calcifications of internal carotid and vertebral arteries at skull base Skull: Posterior scalp hematoma. Bones demineralized. No calvarial fractures. Mild hyperostosis frontalis interna on RIGHT. Sinuses/Orbits: Clear Other: N/A CT CERVICAL SPINE FINDINGS Alignment: Normal Skull base and vertebrae: Osseous demineralization. Visualized skull base intact. Mild scattered  facet degenerative changes of the cervical spine. Scattered disc space narrowing and endplate spur formation at C4-C5 to T1-T2. Vertebral body heights maintained. No acute fracture, subluxation, or bone destruction. Soft tissues and spinal canal: Prevertebral soft tissues normal thickness. Regional cervical soft tissues unremarkable. Disc levels:  No additional significant findings Upper chest: Visualized lung apices clear. Other: N/A IMPRESSION: Tiny acute subdural hematoma along the LEFT lateral aspect of the anterior falx 1-2 mm thick. Small focus of acute subarachnoid hemorrhage at LEFT frontal region. Probable small amount of subarachnoid blood within the LEFT sylvian fissure. No acute parenchymal brain  abnormalities. Atrophy with small vessel chronic ischemic changes of deep cerebral white matter. Degenerative disc and facet disease changes of the cervical spine. No acute cervical spine abnormalities. Critical Value/emergent results were called by telephone at the time of interpretation on 02/06/2018 at 2:01 pm to Dr. Charlesetta Shanks , who verbally acknowledged these results. Electronically Signed   By: Lavonia Dana M.D.   On: 02/06/2018 14:03   Ct Head Wo Contrast  Result Date: 02/05/2018 CLINICAL DATA:  Patient fell backwards hitting posterior head on wall. EXAM: CT HEAD WITHOUT CONTRAST CT CERVICAL SPINE WITHOUT CONTRAST TECHNIQUE: Multidetector CT imaging of the head and cervical spine was performed following the standard protocol without intravenous contrast. Multiplanar CT image reconstructions of the cervical spine were also generated. COMPARISON:  None. Head CT 11/22/2010, radiograph report of the cervical spine from 08/29/1995 FINDINGS: CT HEAD FINDINGS Brain: Atrophy with chronic small vessel ischemic disease. No acute intracranial hemorrhage, midline shift or edema. No hydrocephalus. Midline fourth ventricle and basal cisterns. No intra-axial mass nor extra-axial fluid collections. Vascular: No hyperdense vessel sign. Moderate atherosclerosis of the carotid siphons bilaterally. Skull: No acute skull fracture. Sinuses/Orbits: No acute finding. Other: Right posterior parietal scalp contusion. CT CERVICAL SPINE FINDINGS Alignment: Intact craniocervical relationship and atlantodental interval. Maintained cervical lordosis. Skull base and vertebrae: No acute fracture. No primary bone lesion or focal pathologic process. Osteoarthritis across the atlantodental interval with spurring. Osteoarthritis of the C4-5, C5-6 and C6-7 uncovertebral joints with minimal uncinate spurring. Soft tissues and spinal canal: No prevertebral fluid or swelling. No visible canal hematoma. Disc levels: Moderate disc space  narrowing from C4 through T2 with small dorsal osteophytes from C4 through C7. No significant central canal stenosis or neural foraminal encroachment. Upper chest: Negative. Other: None IMPRESSION: 1. Right posterior parietal scalp contusion without underlying skull fracture. 2. Atrophy with chronic small vessel ischemia. No acute intracranial abnormality. 3. No acute cervical spine fracture or posttraumatic listhesis. Chronic degenerative disc disease C4 through T2. Electronically Signed   By: Ashley Royalty M.D.   On: 02/05/2018 18:10   Ct Cervical Spine Wo Contrast  Result Date: 02/06/2018 CLINICAL DATA:  [place] yesterday striking back of head, fell again today while climbing on a stool, slipped and struck RIGHT side of head on counter, RIGHT ear laceration, neck pain radiating to LEFT shoulder and arm, denies loss of consciousness, having headache and some dizziness EXAM: CT HEAD WITHOUT CONTRAST CT CERVICAL SPINE WITHOUT CONTRAST TECHNIQUE: Multidetector CT imaging of the head and cervical spine was performed following the standard protocol without intravenous contrast. Multiplanar CT image reconstructions of the cervical spine were also generated. Sagittal and coronal MPR images reconstructed from axial data set. COMPARISON:  02/05/2018 FINDINGS: CT HEAD FINDINGS Brain: Generalized atrophy. Normal ventricular morphology. No midline shift or mass effect. Minimal small vessel chronic ischemic changes of deep cerebral white matter. New small focus of subarachnoid hemorrhage  in the LEFT frontal region 9 mm diameter. Probable small amount of subarachnoid hemorrhage in the LEFT sylvian fissure. Tiny subdural hematoma along the LEFT lateral aspect of the anterior falx 1-2 mm thick. No additional intracranial hemorrhage, mass lesion or evidence of acute infarction. Vascular: No hyperdense vessels. Atherosclerotic calcifications of internal carotid and vertebral arteries at skull base Skull: Posterior scalp hematoma.  Bones demineralized. No calvarial fractures. Mild hyperostosis frontalis interna on RIGHT. Sinuses/Orbits: Clear Other: N/A CT CERVICAL SPINE FINDINGS Alignment: Normal Skull base and vertebrae: Osseous demineralization. Visualized skull base intact. Mild scattered facet degenerative changes of the cervical spine. Scattered disc space narrowing and endplate spur formation at C4-C5 to T1-T2. Vertebral body heights maintained. No acute fracture, subluxation, or bone destruction. Soft tissues and spinal canal: Prevertebral soft tissues normal thickness. Regional cervical soft tissues unremarkable. Disc levels:  No additional significant findings Upper chest: Visualized lung apices clear. Other: N/A IMPRESSION: Tiny acute subdural hematoma along the LEFT lateral aspect of the anterior falx 1-2 mm thick. Small focus of acute subarachnoid hemorrhage at LEFT frontal region. Probable small amount of subarachnoid blood within the LEFT sylvian fissure. No acute parenchymal brain abnormalities. Atrophy with small vessel chronic ischemic changes of deep cerebral white matter. Degenerative disc and facet disease changes of the cervical spine. No acute cervical spine abnormalities. Critical Value/emergent results were called by telephone at the time of interpretation on 02/06/2018 at 2:01 pm to Dr. Charlesetta Shanks , who verbally acknowledged these results. Electronically Signed   By: Lavonia Dana M.D.   On: 02/06/2018 14:03   Ct Cervical Spine Wo Contrast  Result Date: 02/05/2018 CLINICAL DATA:  Patient fell backwards hitting posterior head on wall. EXAM: CT HEAD WITHOUT CONTRAST CT CERVICAL SPINE WITHOUT CONTRAST TECHNIQUE: Multidetector CT imaging of the head and cervical spine was performed following the standard protocol without intravenous contrast. Multiplanar CT image reconstructions of the cervical spine were also generated. COMPARISON:  None. Head CT 11/22/2010, radiograph report of the cervical spine from 08/29/1995  FINDINGS: CT HEAD FINDINGS Brain: Atrophy with chronic small vessel ischemic disease. No acute intracranial hemorrhage, midline shift or edema. No hydrocephalus. Midline fourth ventricle and basal cisterns. No intra-axial mass nor extra-axial fluid collections. Vascular: No hyperdense vessel sign. Moderate atherosclerosis of the carotid siphons bilaterally. Skull: No acute skull fracture. Sinuses/Orbits: No acute finding. Other: Right posterior parietal scalp contusion. CT CERVICAL SPINE FINDINGS Alignment: Intact craniocervical relationship and atlantodental interval. Maintained cervical lordosis. Skull base and vertebrae: No acute fracture. No primary bone lesion or focal pathologic process. Osteoarthritis across the atlantodental interval with spurring. Osteoarthritis of the C4-5, C5-6 and C6-7 uncovertebral joints with minimal uncinate spurring. Soft tissues and spinal canal: No prevertebral fluid or swelling. No visible canal hematoma. Disc levels: Moderate disc space narrowing from C4 through T2 with small dorsal osteophytes from C4 through C7. No significant central canal stenosis or neural foraminal encroachment. Upper chest: Negative. Other: None IMPRESSION: 1. Right posterior parietal scalp contusion without underlying skull fracture. 2. Atrophy with chronic small vessel ischemia. No acute intracranial abnormality. 3. No acute cervical spine fracture or posttraumatic listhesis. Chronic degenerative disc disease C4 through T2. Electronically Signed   By: Ashley Royalty M.D.   On: 02/05/2018 18:10    Assessment/Plan Present on Admission: . Subdural hematoma ([place]) -admit to med telemetry -Neurochecks -Blood pressure controlled -Neurology consult in a.m. -Repeat imaging in the a.m. -No anticoagulation ordered  Fall -Mechanical in nature -PT consulted  Kim Hunt 02/06/2018, 7:30 PM

## 2018-02-07 ENCOUNTER — Observation Stay (HOSPITAL_COMMUNITY): Payer: Medicare Other

## 2018-02-07 ENCOUNTER — Encounter (HOSPITAL_COMMUNITY): Payer: Self-pay | Admitting: Radiology

## 2018-02-07 DIAGNOSIS — S0990XA Unspecified injury of head, initial encounter: Secondary | ICD-10-CM

## 2018-02-07 DIAGNOSIS — R51 Headache: Secondary | ICD-10-CM | POA: Diagnosis not present

## 2018-02-07 DIAGNOSIS — S066X0A Traumatic subarachnoid hemorrhage without loss of consciousness, initial encounter: Secondary | ICD-10-CM | POA: Diagnosis not present

## 2018-02-07 DIAGNOSIS — S065X9A Traumatic subdural hemorrhage with loss of consciousness of unspecified duration, initial encounter: Secondary | ICD-10-CM | POA: Diagnosis not present

## 2018-02-07 LAB — CBC
HCT: 38.8 % (ref 36.0–46.0)
Hemoglobin: 12.5 g/dL (ref 12.0–15.0)
MCH: 31 pg (ref 26.0–34.0)
MCHC: 32.2 g/dL (ref 30.0–36.0)
MCV: 96.3 fL (ref 78.0–100.0)
Platelets: 230 10*3/uL (ref 150–400)
RBC: 4.03 MIL/uL (ref 3.87–5.11)
RDW: 13.2 % (ref 11.5–15.5)
WBC: 6 10*3/uL (ref 4.0–10.5)

## 2018-02-07 LAB — COMPREHENSIVE METABOLIC PANEL
ALBUMIN: 3.5 g/dL (ref 3.5–5.0)
ALT: 11 U/L — AB (ref 14–54)
ANION GAP: 12 (ref 5–15)
AST: 21 U/L (ref 15–41)
Alkaline Phosphatase: 79 U/L (ref 38–126)
BUN: 17 mg/dL (ref 6–20)
CALCIUM: 9.3 mg/dL (ref 8.9–10.3)
CO2: 22 mmol/L (ref 22–32)
Chloride: 106 mmol/L (ref 101–111)
Creatinine, Ser: 0.9 mg/dL (ref 0.44–1.00)
GFR calc Af Amer: >60 mL/min (ref >60)
GFR calc non Af Amer: 57 mL/min — ABNORMAL LOW (ref >60)
GLUCOSE: 61 mg/dL — AB (ref 65–99)
Potassium: 3.9 mmol/L (ref 3.5–5.1)
SODIUM: 140 mmol/L (ref 135–145)
Total Bilirubin: 0.8 mg/dL (ref 0.3–1.2)
Total Protein: 6.4 g/dL — ABNORMAL LOW (ref 6.5–8.1)

## 2018-02-07 LAB — LIPID PANEL
CHOL/HDL RATIO: 3.6 ratio
Cholesterol: 182 mg/dL (ref 0–200)
HDL: 50 mg/dL (ref >40)
LDL Cholesterol: 115 mg/dL — ABNORMAL HIGH (ref 0–99)
Triglycerides: 87 mg/dL (ref <150)
VLDL: 17 mg/dL (ref 0–40)

## 2018-02-07 LAB — PROTIME-INR
INR: 1.1
PROTHROMBIN TIME: 14.1 s (ref 11.4–15.2)

## 2018-02-07 MED ORDER — ONDANSETRON HCL 4 MG/2ML IJ SOLN
4.0000 mg | Freq: Four times a day (QID) | INTRAMUSCULAR | Status: DC | PRN
  Administered 2018-02-07: 4 mg via INTRAVENOUS
  Filled 2018-02-07: qty 2

## 2018-02-07 MED ORDER — TRAMADOL HCL 50 MG PO TABS
50.0000 mg | ORAL_TABLET | Freq: Four times a day (QID) | ORAL | Status: DC | PRN
  Administered 2018-02-07: 50 mg via ORAL
  Filled 2018-02-07: qty 1

## 2018-02-07 MED ORDER — ONDANSETRON HCL 4 MG PO TABS: 4.0000 mg | ORAL_TABLET | Freq: Three times a day (TID) | ORAL | 0 refills | Status: AC | PRN

## 2018-02-07 MED ORDER — TRAMADOL HCL 50 MG PO TABS: 50.0000 mg | ORAL_TABLET | Freq: Four times a day (QID) | ORAL | 0 refills | Status: AC | PRN

## 2018-02-07 NOTE — Discharge Instructions (Signed)
Subdural Hematoma °A subdural hematoma is a collection of blood between the brain and its tough outer covering (dura). As the amount of blood increases, it puts pressure on the brain. °There are two types of subdural hematomas: °· Acute. This type develops shortly after a hard, direct hit (blow) to the head and causes blood to collect very quickly. This is a medical emergency. If it is not diagnosed and treated quickly, it can lead to severe brain injury or death. °· Chronic. This is when bleeding develops more slowly, over weeks or months. ° °What are the causes? °This condition is caused by bleeding (hemorrhage) from a broken (ruptured) blood vessel. In most cases, a blood vessel ruptures and bleeds because of injury (trauma) to the head, such as from a hard, direct hit. Head trauma can happen in: °· Traffic accidents. °· Falls. °· Assaults. °· Sport injuries. ° °In rare cases, hemorrhage can happen without a known cause (spontaneously), especially if you take blood thinners (anticoagulants). °What increases the risk? °This condition is more likely to develop in: °· Older people. °· Infants. °· People who take blood thinners. °· People who have injured their head. °· People who abuse alcohol. ° °What are the signs or symptoms? °Depending on the size of the hematoma, symptoms can vary from mild to severe and life-threatening. Symptoms in acute subdural hematoma can develop over minutes or hours. Symptoms in chronic subdural hematoma may develop over weeks or months. °· Headaches. °· Nausea or vomiting. °· Changes in vision, such as double vision or loss of vision. °· Changes in speech. °· Loss of balance or difficulty walking. °· Weakness, numbness, or tingling in the arms or legs on one side of the body. °· Jerky movements that you cannot control (seizures). °· Change in personality. °· Increased sleepiness. °· Memory loss. °· Loss of consciousness. °· Coma. ° °How is this diagnosed? °This condition is diagnosed  based on the results of: °· A physical and neurological exam. °· CT scan. °· MRI. ° °How is this treated? °Treatment for this condition depends on the severity and the type of subdural hematoma that you have. You may need to temporarily stop taking blood thinners, if this applies. You may be given antiseizure (anticonvulsant) medicine. °Treatment for acute subdural hematoma may include: °· Medicines that help the body get rid of excess fluids (diuretics). These may help reduce pressure in the brain. °· Assisted breathing (ventilation). This involves using a machine called a ventilator to help you breathe. This helps to reduce pressure in the brain, especially if there is swelling of the brain. °· Emergency surgery to drain blood or remove a blood clot. ° °Treatment for chronic subdural hematoma may include: °· Observation and bed rest at the hospital. °· Emergency surgery. This may be done if the bleeding is large, or if you have neurological symptoms such as weakness or numbness. ° °Sometimes, no treatment is needed for chronic subdural hematoma. °Follow these instructions at home: °Activity °· Avoid any situation where there is potential for another head injury, such as football, hockey, soccer, basketball, martial arts, downhill snow sports, and horseback riding. Do not do these activities until your health care provider approves. °? If you play a contact sport and you experience a head injury, follow advice from your health care provider about when you can return to the sport. If you get another injury while you are healing, you may experience another hemorrhage. °· Avoid excessive visual stimulation while recovering. This includes working   on the computer, watching TV, and reading. °· Try to avoid activities that cause physical or mental stress. Stay home from work or school as directed by your health care provider. °· Do not drive, ride a bicycle, or use heavy machinery until your health care provider  approves. °· Do not lift anything that is heavier than 5 lb (2.3 kg) until your health care provider approves. °· If physical therapy was prescribed, do exercises as told by your health care provider or physical therapist. °· Rest as told by your health care provider. Rest helps the brain to heal. °· Make sure you: °? Get plenty of sleep. Avoid staying up late at night. °? Keep a consistent sleep schedule. Try to go to sleep and wake up at about the same time every day. °General instructions °· Recovery from brain injuries varies widely. Talk with your health care provider about what to expect. Monitor your symptoms, and ask people around you to do the same. °· Take over-the-counter and prescription medicines only as told by your health care provider. Do not take blood thinners or NSAIDs unless your health care provider approves. This includes aspirin, ibuprofen, naproxen, and warfarin. °· Limit alcohol intake to no more than 1 drink per day for nonpregnant women and 2 drinks per day for men. One drink equals 12 oz of beer, 5 oz of wine, or 1½ oz of hard liquor. °· Keep all follow-up visits as told by your health care provider. This is important. °How is this prevented? °· Wear protective gear, such as helmets, when participating in activities such as biking or contact sports. °· Always wear a seat belt when you are in a motor vehicle. °· Keep your home environment safe to reduce the risk of falling: °? Remove clutter and tripping hazards from floors and stairways, such as loose rugs and extension cords. °? Use grab bars in bathrooms and handrails by stairs. °? Place non-slip mats on floors and in bathtubs. °? Improve lighting in dim areas. °Where to find more information: °· National Institute of Neurological Disorders and Stroke: www.ninds.nih.gov °· American Association of Neurological Surgeons: http://www.aans.org °· American Academy of Neurology (AAN): www.aan.com °· Brain Injury Association of America:  www.biausa.org °Get help right away if: °· You develop symptoms of subdural hematoma. °· You are taking blood thinners and you fall or you experience minor trauma to the head. If you take any blood thinners, even a very small injury can cause a subdural hematoma. You should get help right away, even if you think your symptoms are mild. °· You have a bleeding disorder and you fall or you experience minor trauma to the head. °· You experience a head injury and you develop any of the following symptoms: °? Clear fluid draining from your nose or ears. °? Nausea. °? Vomiting. °? Slurred speech. °? Seizures. °? Drowsiness or a decrease in alertness. °? Double vision. °? Numbness or inability to move (paralysis) in any part of your body. °? Difficulty walking or poor coordination. °? Difficulty thinking. °? Confusion or forgetfulness. °? Personality changes. °? Irrational or aggressive behavior. °? A history of heavy alcohol use. °These symptoms may represent a serious problem that is an emergency. Do not wait to see if the symptoms will go away. Get medical help right away. Call your local emergency services (911 in the [place] Do not drive yourself to the hospital. °Summary °· A subdural hematoma is a collection of blood between the brain and its tough outer covering. °·   Treatment for this condition depends on the severity and the type of subdural hemorrhage that you have. °· Symptoms can vary from mild to severe and life-threatening. °· Monitor your symptoms, and ask others around you to do the same. °This information is not intended to replace advice given to you by your health care provider. Make sure you discuss any questions you have with your health care provider. °Document Released: 09/20/2004 Document Revised: 10/08/2016 Document Reviewed: 10/08/2016 °[employer]vier Interactive Patient Education © 2018 [employer] ° °
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
## 2018-02-07 NOTE — Evaluation (Signed)
Physical Therapy Evaluation Patient Details Name: Kim Hunt MRN: [id number] DOB: 07/04/1931 Today's Date: 02/07/2018   History of Present Illness  84 y.o. female admitted after 2 mechanical falls at home. CT (+) for 2 small SDH and pt sustained a R ear laceration, posterior scalp hematoma, and headache. PMH significant for cancer.    Clinical Impression  Pt presents at/near baseline function.  Has some age-related balance deficits and was instructed on simple additions to daily routine to maintain balance and leg strength.  Instructed to seek PT referral if function changes.  Has adequate help at home and no physical limitations to prevent safe return.  No PT follow up at this time.  Thank you,    Follow Up Recommendations No PT follow up    Equipment Recommendations  None recommended by PT    Recommendations for Other Services       Precautions / Restrictions Precautions Precautions: Fall      Mobility  Bed Mobility Overal bed mobility: Independent             General bed mobility comments: HOB flat, no use of bedrails  Transfers Overall transfer level: Modified independent Equipment used: None             General transfer comment: mildly unsteady on first rise, uses edge of table to steady self, otherwise unassisted  Ambulation/Gait Ambulation/Gait assistance: Supervision Ambulation Distance (Feet): 150 Feet Assistive device: None Gait Pattern/deviations: Step-through pattern;Decreased stride length;Narrow base of support     General Gait Details: untimed, but slightly slower than normal for age; 1 or 2 instances of mild LOB self-corrected, no obvious fatigue or other issues; pt denies any problems or worries with gait/balance  Stairs            Wheelchair Mobility    Modified Rankin (Stroke Patients Only)       Balance Overall balance assessment: Mild deficits observed, not formally tested   Sitting balance-Leahy Scale: Normal       Standing balance-Leahy Scale: Good                               Pertinent Vitals/Pain Pain Assessment: 0-10 Pain Score: 6  Pain Location: headache, neck, and back of head Pain Descriptors / Indicators: Aching;Headache Pain Intervention(s): Limited activity within patient's tolerance;Monitored during session    Home Living Family/patient expects to be discharged to:: Private residence Living Arrangements: Spouse/significant other;Children Available Help at Discharge: Family;Available 24 hours/day Type of Home: House       Home Layout: One level;Laundry or work area in [place]: Environmental consultant - 2 wheels;Cane - single point;Bedside commode;Adaptive equipment Additional Comments: Pt lives with her husband, son who has developmental disabilities, daughter and her 2 children    Prior Function Level of Independence: Independent               Hand Dominance   Dominant Hand: Right    Extremity/Trunk Assessment   Upper Extremity Assessment Upper Extremity Assessment: Defer to OT evaluation    Lower Extremity Assessment Lower Extremity Assessment: Overall WFL for tasks assessed    Cervical / Trunk Assessment Cervical / Trunk Assessment: Normal  Communication   Communication: No difficulties  Cognition Arousal/Alertness: Awake/alert Behavior During Therapy: WFL for tasks assessed/performed Overall Cognitive Status: Within Functional Limits for tasks assessed  General Comments: talkative      General Comments General comments (skin integrity, edema, etc.): Pt attributes falls to carelessness and bad decisions    Exercises     Assessment/Plan    PT Assessment Patent does not need any further PT services  PT Problem List         PT Treatment Interventions      PT Goals (Current goals can be found in the Care Plan section)  Acute Rehab PT Goals Patient Stated Goal: To get back home PT  Goal Formulation: All assessment and education complete, DC therapy    Frequency     Barriers to discharge        Co-evaluation               AM-PAC PT "6 Clicks" Daily Activity  Outcome Measure Difficulty turning over in bed (including adjusting bedclothes, sheets and blankets)?: None Difficulty moving from lying on back to sitting on the side of the bed? : None Difficulty sitting down on and standing up from a chair with arms (e.g., wheelchair, bedside commode, etc,.)?: None Help needed moving to and from a bed to chair (including a wheelchair)?: None Help needed walking in hospital room?: None Help needed climbing 3-5 steps with a railing? : None 6 Click Score: 24    End of Session Equipment Utilized During Treatment: Gait belt Activity Tolerance: Patient tolerated treatment well Patient left: in bed;with family/visitor present Nurse Communication: Mobility status PT Visit Diagnosis: History of falling (Z91.81)    Time: 1510-1530 PT Time Calculation (min) (ACUTE ONLY): 20 min   Charges:   PT Evaluation $PT Eval Low Complexity: 1 Low     PT G Codes:        Kearney Hard, PT, DPT, MS Board Certified Geriatric Clinical Specialist  Herbie Drape 02/07/2018, 3:35 PM

## 2018-02-07 NOTE — Progress Notes (Signed)
Patient ready for discharge to home; discharge instructions given and reviewed; Rx sent electronically; patient discharged out via wheelchair accompanied by her husband.

## 2018-02-07 NOTE — Discharge Summary (Signed)
Physician Discharge Summary  Kim Hunt EVO:[id number] DOB: [birth year] DOA: 02/06/2018  PCP: [facility]  Admit date: 02/06/2018 Discharge date: 02/07/2018  Time spent: 45 minutes  Recommendations for Outpatient Follow-up:  Patient will be discharged to home.  Patient will need to follow up with primary care provider within one week of discharge.  Patient should continue medications as prescribed.  Patient should follow a regular diet.   Discharge Diagnoses:  Subdural hematoma/subarachnoid hemorrhage Headache Nausea Fall Essential hypertension History of breast cancer  Discharge Condition: Stable  Diet recommendation: heart healthy  Filed Weights   02/06/18 1158  Weight: 77.1 kg (170 lb)    History of present illness:  On 02/06/2018 by Dr. Quintella Baton [name] who presents after 2 falls in 2 days,  Both times she hit her head..  They were mechanical falls. Yesterdays fall occurred after she was bathing her dog. She's  had 2 prior knee surgeries, her knee gave out and she failed to hold on to the bath tub. She fell backwards and hit the right side of her head. She went to the ER, w/u including CT head was negative and she was sent home.  This AM she was climbing on a chair to get something off the top of her refrigerator. She fell and hit the right side of her face. She had head and neck ain. She returned to the ER where repeat Ct head revealed 2 small SDH. She was transferred to [facility] for observation. The patient is not on any blood thinners. She did take an aduil yesterday for pain. Here she is alert and oriented. She was able to provide all history. She is accompanied by her daughter and husband at bedside. She denies dizziness, nausea, vomiting or confusion.  Hospital Course:  Subdural hematoma/subarachnoid hemorrhage -secondary to fall -CT head: Tiny acute subdural hematoma along the left lateral aspect of anterior falx 1-2 mm thick.  Small  focus of acute subarachnoid hemorrhage at left frontal region.  Probable small amount of subarachnoid blood within the left sylvian fissure. -CT cervical spine: Negative disc and facet disease changes of the cervical spine.  No acute cervical spine abnormalities. -Neurosurgery consulted and appreciated. Reviewed imaging. No surgical indication, no repeat imaging necessary unless neurological exam declines.  -MRI brain: small volume SAH in left bridle, left frontal, left sylvian regions.  Thin subdural hemorrhage over the left anterior falx and right frontal lobe.  Intracranial hemorrhage is similar in comparison with CT given differences in technique.  No acute stroke or mass-effect.  Headache -secondary to the above -continue tylenol, tramadol  Nausea -possible related to the above -Zofran given  Fall -PT and OT evaluated patient, no further therapy needs  Essential hypertension -Continue verapamil at discharge  History of breast cancer -Continue arimidex  Procedures: none  Consultations: neurosurgery   Discharge Exam: Vitals:   02/07/18 0852 02/07/18 1335  BP: 138/60 (!) 142/72  Pulse: 73 80  Resp: 18 20  Temp: 98.2 F (36.8 C) 98.3 F (36.8 C)  SpO2: 95% 96%   Patient seen and examined. Denies current chest pain, shortness of breath, abdominal pain, nausea, vomiting, diarrhea, constipation. Does endorse headache and neck pain.    General: Well developed, well nourished, NAD, appears stated age  71: NCAT, mucous membranes moist. Small laceration- left ear  Neck: Supple  Cardiovascular: S1 S2 auscultated, RRR, no murmur  Respiratory: Clear to auscultation bilaterally with equal chest rise  Abdomen: Soft, nontender, nondistended, +  bowel sounds  Extremities: warm dry without cyanosis clubbing or edema  Neuro: AAOx3,  nonfocal  Skin: Without rashes exudates or nodules  Psych: Normal affect and demeanor with intact judgement and insight  Discharge  Instructions Discharge Instructions    Discharge instructions   Complete by:  As directed    Patient will be discharged to home.  Patient will need to follow up with primary care provider within one week of discharge.  Patient should continue medications as prescribed.  Patient should follow a regular diet.     Allergies as of 02/07/2018      Reactions   Clarithromycin Other (See Comments)   Unknown reaction   Relafen [nabumetone] Other (See Comments)   Unknown reaction   Sulfa Antibiotics Nausea And Vomiting      Medication List    STOP taking these medications   ibuprofen 200 MG tablet Commonly known as:  ADVIL,MOTRIN     TAKE these medications   anastrozole 1 MG tablet Commonly known as:  ARIMIDEX Take 1 mg by mouth at bedtime.   ciclopirox 8 % solution Commonly known as:  PENLAC Apply 1 application topically See admin instructions. Apply over toe nail and surrounding skin. Apply daily over previous coat. After seven (7) days,  remove with nail polish remover and continue cycle.   fluticasone 50 MCG/ACT nasal spray Commonly known as:  FLONASE Place 1 spray into both nostrils 2 (two) times daily as needed for allergies or rhinitis.   gabapentin 300 MG capsule Commonly known as:  NEURONTIN Take 300 mg by mouth 2 (two) times daily as needed (pain).   ondansetron 4 MG tablet Commonly known as:  ZOFRAN Take 1 tablet (4 mg total) by mouth every 8 (eight) hours as needed for nausea or vomiting.   traMADol 50 MG tablet Commonly known as:  ULTRAM Take 1 tablet (50 mg total) by mouth every 6 (six) hours as needed for moderate pain or severe pain.   verapamil 240 MG CR tablet Commonly known as:  CALAN-SR Take 240 mg by mouth daily.   Vitamin D (Ergocalciferol) 50000 units Caps capsule Commonly known as:  DRISDOL Take 50,000 Units by mouth every Sunday.      Allergies  Allergen Reactions  . Clarithromycin Other (See Comments)    Unknown reaction  . Relafen  [Nabumetone] Other (See Comments)    Unknown reaction  . Sulfa Antibiotics Nausea And Vomiting   Follow-up [facility]. Schedule an appointment as soon as possible for a visit in 1 week(s).   Why:  Hospital follow up Contact information: [place] [id number] [phone]            The results of significant diagnostics from this hospitalization (including imaging, microbiology, ancillary and laboratory) are listed below for reference.    Significant Diagnostic Studies: Ct Head Wo Contrast  Result Date: 02/06/2018 CLINICAL DATA:  [place] yesterday striking back of head, fell again today while climbing on a stool, slipped and struck RIGHT side of head on counter, RIGHT ear laceration, neck pain radiating to LEFT shoulder and arm, denies loss of consciousness, having headache and some dizziness EXAM: CT HEAD WITHOUT CONTRAST CT CERVICAL SPINE WITHOUT CONTRAST TECHNIQUE: Multidetector CT imaging of the head and cervical spine was performed following the standard protocol without intravenous contrast. Multiplanar CT image reconstructions of the cervical spine were also generated. Sagittal and coronal MPR images reconstructed from axial data set. COMPARISON:  02/05/2018 FINDINGS:  CT HEAD FINDINGS Brain: Generalized atrophy. Normal ventricular morphology. No midline shift or mass effect. Minimal small vessel chronic ischemic changes of deep cerebral white matter. New small focus of subarachnoid hemorrhage in the LEFT frontal region 9 mm diameter. Probable small amount of subarachnoid hemorrhage in the LEFT sylvian fissure. Tiny subdural hematoma along the LEFT lateral aspect of the anterior falx 1-2 mm thick. No additional intracranial hemorrhage, mass lesion or evidence of acute infarction. Vascular: No hyperdense vessels. Atherosclerotic calcifications of internal carotid and vertebral arteries at skull base Skull: Posterior scalp hematoma. Bones  demineralized. No calvarial fractures. Mild hyperostosis frontalis interna on RIGHT. Sinuses/Orbits: Clear Other: N/A CT CERVICAL SPINE FINDINGS Alignment: Normal Skull base and vertebrae: Osseous demineralization. Visualized skull base intact. Mild scattered facet degenerative changes of the cervical spine. Scattered disc space narrowing and endplate spur formation at C4-C5 to T1-T2. Vertebral body heights maintained. No acute fracture, subluxation, or bone destruction. Soft tissues and spinal canal: Prevertebral soft tissues normal thickness. Regional cervical soft tissues unremarkable. Disc levels:  No additional significant findings Upper chest: Visualized lung apices clear. Other: N/A IMPRESSION: Tiny acute subdural hematoma along the LEFT lateral aspect of the anterior falx 1-2 mm thick. Small focus of acute subarachnoid hemorrhage at LEFT frontal region. Probable small amount of subarachnoid blood within the LEFT sylvian fissure. No acute parenchymal brain abnormalities. Atrophy with small vessel chronic ischemic changes of deep cerebral white matter. Degenerative disc and facet disease changes of the cervical spine. No acute cervical spine abnormalities. Critical Value/emergent results were called by telephone at the time of interpretation on 02/06/2018 at 2:01 pm to Dr. Charlesetta Shanks , who verbally acknowledged these results. Electronically Signed   By: Lavonia Dana M.D.   On: 02/06/2018 14:03   Ct Head Wo Contrast  Result Date: 02/05/2018 CLINICAL DATA:  Patient fell backwards hitting posterior head on wall. EXAM: CT HEAD WITHOUT CONTRAST CT CERVICAL SPINE WITHOUT CONTRAST TECHNIQUE: Multidetector CT imaging of the head and cervical spine was performed following the standard protocol without intravenous contrast. Multiplanar CT image reconstructions of the cervical spine were also generated. COMPARISON:  None. Head CT 11/22/2010, radiograph report of the cervical spine from 08/29/1995 FINDINGS: CT HEAD  FINDINGS Brain: Atrophy with chronic small vessel ischemic disease. No acute intracranial hemorrhage, midline shift or edema. No hydrocephalus. Midline fourth ventricle and basal cisterns. No intra-axial mass nor extra-axial fluid collections. Vascular: No hyperdense vessel sign. Moderate atherosclerosis of the carotid siphons bilaterally. Skull: No acute skull fracture. Sinuses/Orbits: No acute finding. Other: Right posterior parietal scalp contusion. CT CERVICAL SPINE FINDINGS Alignment: Intact craniocervical relationship and atlantodental interval. Maintained cervical lordosis. Skull base and vertebrae: No acute fracture. No primary bone lesion or focal pathologic process. Osteoarthritis across the atlantodental interval with spurring. Osteoarthritis of the C4-5, C5-6 and C6-7 uncovertebral joints with minimal uncinate spurring. Soft tissues and spinal canal: No prevertebral fluid or swelling. No visible canal hematoma. Disc levels: Moderate disc space narrowing from C4 through T2 with small dorsal osteophytes from C4 through C7. No significant central canal stenosis or neural foraminal encroachment. Upper chest: Negative. Other: None IMPRESSION: 1. Right posterior parietal scalp contusion without underlying skull fracture. 2. Atrophy with chronic small vessel ischemia. No acute intracranial abnormality. 3. No acute cervical spine fracture or posttraumatic listhesis. Chronic degenerative disc disease C4 through T2. Electronically Signed   By: Ashley Royalty M.D.   On: 02/05/2018 18:10   Ct Cervical Spine Wo Contrast  Result Date: 02/06/2018 CLINICAL DATA:  Fell yesterday striking back of head, fell again today while climbing on a stool, slipped and struck RIGHT side of head on counter, RIGHT ear laceration, neck pain radiating to LEFT shoulder and arm, denies loss of consciousness, having headache and some dizziness EXAM: CT HEAD WITHOUT CONTRAST CT CERVICAL SPINE WITHOUT CONTRAST TECHNIQUE: Multidetector CT  imaging of the head and cervical spine was performed following the standard protocol without intravenous contrast. Multiplanar CT image reconstructions of the cervical spine were also generated. Sagittal and coronal MPR images reconstructed from axial data set. COMPARISON:  02/05/2018 FINDINGS: CT HEAD FINDINGS Brain: Generalized atrophy. Normal ventricular morphology. No midline shift or mass effect. Minimal small vessel chronic ischemic changes of deep cerebral white matter. New small focus of subarachnoid hemorrhage in the LEFT frontal region 9 mm diameter. Probable small amount of subarachnoid hemorrhage in the LEFT sylvian fissure. Tiny subdural hematoma along the LEFT lateral aspect of the anterior falx 1-2 mm thick. No additional intracranial hemorrhage, mass lesion or evidence of acute infarction. Vascular: No hyperdense vessels. Atherosclerotic calcifications of internal carotid and vertebral arteries at skull base Skull: Posterior scalp hematoma. Bones demineralized. No calvarial fractures. Mild hyperostosis frontalis interna on RIGHT. Sinuses/Orbits: Clear Other: N/A CT CERVICAL SPINE FINDINGS Alignment: Normal Skull base and vertebrae: Osseous demineralization. Visualized skull base intact. Mild scattered facet degenerative changes of the cervical spine. Scattered disc space narrowing and endplate spur formation at C4-C5 to T1-T2. Vertebral body heights maintained. No acute fracture, subluxation, or bone destruction. Soft tissues and spinal canal: Prevertebral soft tissues normal thickness. Regional cervical soft tissues unremarkable. Disc levels:  No additional significant findings Upper chest: Visualized lung apices clear. Other: N/A IMPRESSION: Tiny acute subdural hematoma along the LEFT lateral aspect of the anterior falx 1-2 mm thick. Small focus of acute subarachnoid hemorrhage at LEFT frontal region. Probable small amount of subarachnoid blood within the LEFT sylvian fissure. No acute parenchymal  brain abnormalities. Atrophy with small vessel chronic ischemic changes of deep cerebral white matter. Degenerative disc and facet disease changes of the cervical spine. No acute cervical spine abnormalities. Critical Value/emergent results were called by telephone at the time of interpretation on 02/06/2018 at 2:01 pm to Dr. Charlesetta Shanks , who verbally acknowledged these results. Electronically Signed   By: Lavonia Dana M.D.   On: 02/06/2018 14:03   Ct Cervical Spine Wo Contrast  Result Date: 02/05/2018 CLINICAL DATA:  Patient fell backwards hitting posterior head on wall. EXAM: CT HEAD WITHOUT CONTRAST CT CERVICAL SPINE WITHOUT CONTRAST TECHNIQUE: Multidetector CT imaging of the head and cervical spine was performed following the standard protocol without intravenous contrast. Multiplanar CT image reconstructions of the cervical spine were also generated. COMPARISON:  None. Head CT 11/22/2010, radiograph report of the cervical spine from 08/29/1995 FINDINGS: CT HEAD FINDINGS Brain: Atrophy with chronic small vessel ischemic disease. No acute intracranial hemorrhage, midline shift or edema. No hydrocephalus. Midline fourth ventricle and basal cisterns. No intra-axial mass nor extra-axial fluid collections. Vascular: No hyperdense vessel sign. Moderate atherosclerosis of the carotid siphons bilaterally. Skull: No acute skull fracture. Sinuses/Orbits: No acute finding. Other: Right posterior parietal scalp contusion. CT CERVICAL SPINE FINDINGS Alignment: Intact craniocervical relationship and atlantodental interval. Maintained cervical lordosis. Skull base and vertebrae: No acute fracture. No primary bone lesion or focal pathologic process. Osteoarthritis across the atlantodental interval with spurring. Osteoarthritis of the C4-5, C5-6 and C6-7 uncovertebral joints with minimal uncinate spurring. Soft tissues and spinal canal: No prevertebral fluid or swelling. No visible canal hematoma.  Disc levels: Moderate  disc space narrowing from C4 through T2 with small dorsal osteophytes from C4 through C7. No significant central canal stenosis or neural foraminal encroachment. Upper chest: Negative. Other: None IMPRESSION: 1. Right posterior parietal scalp contusion without underlying skull fracture. 2. Atrophy with chronic small vessel ischemia. No acute intracranial abnormality. 3. No acute cervical spine fracture or posttraumatic listhesis. Chronic degenerative disc disease C4 through T2. Electronically Signed   By: Ashley Royalty M.D.   On: 02/05/2018 18:10   Mr Brain Wo Contrast  Result Date: 02/07/2018 CLINICAL DATA:  84 y/o F; multiple falls with intracranial hemorrhage. EXAM: MRI HEAD WITHOUT CONTRAST TECHNIQUE: Multiplanar, multiecho pulse sequences of the brain and surrounding structures were obtained without intravenous contrast. COMPARISON:  02/06/2018 CT head. FINDINGS: Brain: No acute infarction, hydrocephalus, extra-axial collection or mass lesion. Fewnonspecific foci of T2 FLAIR hyperintense signal abnormality in subcortical and periventricular white matter are compatible withmildchronic microvascular ischemic changes for age. Mildbrain parenchymal volume loss. There is curvilinear susceptibility hypointensity in the left parietal region, left frontal region, and left sylvian fissure compatible with subarachnoid hemorrhage. There is thin subdural hemorrhage along the left anterior falx and overlying right frontal lobe. Intracranial hemorrhage is similar to prior CT given differences in technique and increased sensitivity of MRI. Vascular: Normal flow voids. Skull and upper cervical spine: Normal marrow signal. Sinuses/Orbits: Mild mucosal thickening of the maxillary sinuses. Trace right mastoid air cell effusion. Bilateral intra-ocular lens replacement. Other: None. IMPRESSION: 1. Small volume subarachnoid hemorrhage in the left parietal, left frontal, and left sylvian regions. Thin subdural hemorrhage over the  left anterior falx and right frontal lobe. Intracranial hemorrhage is similar in comparison with CT given differences in technique. 2. No acute stroke or mass effect. 3. Mild chronic microvascular ischemic changes and mild parenchymal volume loss of the brain. Electronically Signed   By: Kristine Garbe M.D.   On: 02/07/2018 13:11    Microbiology: No results found for this or any previous visit (from the past 240 hour(s)).   Labs: Basic Metabolic Panel: Recent Labs  Lab 02/06/18 1423 02/07/18 0438  NA 139 140  K 4.1 3.9  CL 107 106  CO2 23 22  GLUCOSE 93 61*  BUN 19 17  CREATININE 0.80 0.90  CALCIUM 9.4 9.3   Liver Function Tests: Recent Labs  Lab 02/07/18 0438  AST 21  ALT 11*  ALKPHOS 79  BILITOT 0.8  PROT 6.4*  ALBUMIN 3.5   No results for input(s): LIPASE, AMYLASE in the last 168 hours. No results for input(s): AMMONIA in the last 168 hours. CBC: Recent Labs  Lab 02/06/18 1423 02/07/18 0438  WBC 7.7 6.0  NEUTROABS 6.0  --   HGB 12.4 12.5  HCT 37.2 38.8  MCV 94.9 96.3  PLT 213 230   Cardiac Enzymes: No results for input(s): CKTOTAL, CKMB, CKMBINDEX, TROPONINI in the last 168 hours. BNP: BNP (last 3 results) No results for input(s): BNP in the last 8760 hours.  ProBNP (last 3 results) No results for input(s): PROBNP in the last 8760 hours.  CBG: No results for input(s): GLUCAP in the last 168 hours.     Signed:  Cristal Ford  [facility] 02/07/2018, 3:51 PM

## 2018-02-27 ENCOUNTER — Encounter (HOSPITAL_BASED_OUTPATIENT_CLINIC_OR_DEPARTMENT_OTHER): Payer: Self-pay | Admitting: Adult Health

## 2018-02-27 ENCOUNTER — Other Ambulatory Visit: Payer: Self-pay

## 2018-02-27 ENCOUNTER — Emergency Department (HOSPITAL_BASED_OUTPATIENT_CLINIC_OR_DEPARTMENT_OTHER)
Admission: EM | Admit: 2018-02-27 | Discharge: 2018-02-28 | Disposition: A | Discharge status: Home / Self Care | Payer: Medicare Other | Attending: Emergency Medicine | Admitting: Emergency Medicine

## 2018-02-27 ENCOUNTER — Emergency Department (HOSPITAL_BASED_OUTPATIENT_CLINIC_OR_DEPARTMENT_OTHER): Payer: Medicare Other

## 2018-02-27 DIAGNOSIS — Z79899 Other long term (current) drug therapy: Secondary | ICD-10-CM | POA: Diagnosis not present

## 2018-02-27 DIAGNOSIS — Z859 Personal history of malignant neoplasm, unspecified: Secondary | ICD-10-CM | POA: Diagnosis not present

## 2018-02-27 DIAGNOSIS — M5432 Sciatica, left side: Secondary | ICD-10-CM | POA: Insufficient documentation

## 2018-02-27 DIAGNOSIS — M25552 Pain in left hip: Secondary | ICD-10-CM | POA: Diagnosis present

## 2018-02-27 MED ORDER — PREDNISONE 10 MG PO TABS: 20.0000 mg | ORAL_TABLET | Freq: Two times a day (BID) | ORAL | 0 refills | Status: AC

## 2018-02-27 MED ORDER — HYDROCODONE-ACETAMINOPHEN 5-325 MG PO TABS: 1.0000 | ORAL_TABLET | Freq: Four times a day (QID) | ORAL | 0 refills | Status: AC | PRN

## 2018-02-27 MED ORDER — HYDROCODONE-ACETAMINOPHEN 5-325 MG PO TABS
2.0000 | ORAL_TABLET | Freq: Once | ORAL | Status: AC
  Administered 2018-02-28: 2 via ORAL
  Filled 2018-02-27: qty 2

## 2018-02-27 NOTE — ED Provider Notes (Signed)
[facility] EMER[facility]ENC[facility] DEPARTMENT Provider Note   CSN: [id number] Arrival date & time: 02/27/18  2250     History   Chief Complaint Chief Complaint  Patient presents with  . Leg Pain    HPI Kim Hunt is a 84 y.o. female.  Patient is an 84 year old female with past medical history of recent traumatic subdural hematoma presenting for evaluation of left hip and posterior thigh pain.  This is been ongoing for the past 3 days.  She reports falling and striking her head on the kitchen counter 2 weeks ago.  She was found to have a small subdural with slight subarachnoid hemorrhage.  She was observed in the hospital, then discharged the following day.  She denies any injury to her hip.  Her left hip began hurting 3 days ago with no new injury or trauma.  The pain is in her left buttock and upper posterior thigh.  She denies any numbness or tingling.  She denies any swelling of her leg.  She denies any back pain, bowel, or bladder complaints.  The history is provided by the patient.  Hip Pain  This is a new problem. Episode onset: 3 days ago. Episode frequency: Intermittently. The problem has not changed since onset.The symptoms are aggravated by walking. Nothing relieves the symptoms. She has tried nothing for the symptoms.    Past Medical History:  Diagnosis Date  . Cancer [facility])     Patient Active Problem List   Diagnosis Date Noted  . Subdural hematoma ([place]) 02/06/2018    Past Surgical History:  Procedure Laterality Date  . APPENDECTOMY    . BLADDER SURGERY    . BREAST SURGERY    . CHOLECYSTECTOMY    . TUBAL LIGATION       OB History   None      Home Medications    Prior to Admission medications   Medication Sig Start Date End Date Taking? Authorizing Provider  anastrozole (ARIMIDEX) 1 MG tablet Take 1 mg by mouth at bedtime.     [provider]  ciclopirox (PENLAC) 8 % solution Apply 1 application topically See admin instructions. Apply  over toe nail and surrounding skin. Apply daily over previous coat. After seven (7) days,  remove with nail polish remover and continue cycle.    [provider]  fluticasone (FLONASE) 50 MCG/ACT nasal spray Place 1 spray into both nostrils 2 (two) times daily as needed for allergies or rhinitis.    [provider]  gabapentin (NEURONTIN) 300 MG capsule Take 300 mg by mouth 2 (two) times daily as needed (pain).    [provider]  ondansetron (ZOFRAN) 4 MG tablet Take 1 tablet (4 mg total) by mouth every 8 (eight) hours as needed for nausea or vomiting. 02/07/18   Cristal Ford, DO  traMADol (ULTRAM) 50 MG tablet Take 1 tablet (50 mg total) by mouth every 6 (six) hours as needed for moderate pain or severe pain. 02/07/18   Mikhail, Velta Addison, DO  verapamil (CALAN-SR) 240 MG CR tablet Take 240 mg by mouth daily.    [provider]  Vitamin D, Ergocalciferol, (DRISDOL) 50000 units CAPS capsule Take 50,000 Units by mouth every Sunday.    [provider]    Family History History reviewed. No pertinent family history.  Social History Social History   Tobacco Use  . Smoking status: Never Smoker  . Smokeless tobacco: Never Used  Substance Use Topics  . Alcohol use: Never  Frequency: Never  . Drug use: Never     Allergies   Clarithromycin; Relafen [nabumetone]; and Sulfa antibiotics   Review of Systems Review of Systems  All other systems reviewed and are negative.    Physical Exam Updated Vital Signs BP (!) 146/87   Pulse 77   Temp 98 F (36.7 C) (Oral)   Resp 18   Ht 5\' 2"  (1.575 m)   Wt 73.5 kg (162 lb)   SpO2 98%   BMI 29.63 kg/m   Physical Exam  Constitutional: She is oriented to person, place, and time. She appears well-developed and well-nourished. No distress.  HENT:  Head: Normocephalic and atraumatic.  Neck: Normal range of motion. Neck supple.  Cardiovascular: Normal rate and regular rhythm. Exam reveals no gallop  and no friction rub.  No murmur heard. Pulmonary/Chest: Effort normal and breath sounds normal. No respiratory distress. She has no wheezes.  Abdominal: Soft. Bowel sounds are normal. She exhibits no distension. There is no tenderness.  Musculoskeletal: Normal range of motion.  There is tenderness to palpation in the posterior upper thigh just below the buttock.  There is no palpable abnormality.  There is no shortening or rotation of the leg.  DP and PT pulses are easily palpable.  She has full range of motion of both feet and sensation is intact to both lower extremities.  DTRs are 1+ and symmetrical in the patellar and Achilles tendons bilaterally.  Strength is 5 out of 5 in both lower extremities.  Neurological: She is alert and oriented to person, place, and time.  Skin: Skin is warm and dry. She is not diaphoretic.  Nursing note and vitals reviewed.    ED Treatments / Results  Labs (all labs ordered are listed, but only abnormal results are displayed) Labs Reviewed - No data to display  EKG None  Radiology No results found.  Procedures Procedures (including critical care time)  Medications Ordered in ED Medications  HYDROcodone-acetaminophen (NORCO/VICODIN) 5-325 MG per tablet 2 tablet (has no administration in time range)     Initial Impression / Assessment and Plan / ED Course  I have reviewed the triage vital signs and the nursing notes.  Pertinent labs & imaging results that were available during my care of the patient were reviewed by me and considered in my medical decision making (see chart for details).  X-rays are unremarkable for fracture or other abnormality.  Her presentation and exam is most consistent with sciatica.  This will be treated with prednisone, hydrocodone, and follow-up with primary doctor if not improving.  There are no signs or symptoms of any emergent process and I do not feel as though further emergent imaging is indicated.  Final Clinical  Impressions(s) / ED Diagnoses   Final diagnoses:  None    ED Discharge Orders    None       Veryl Speak, MD 02/27/18 2359

## 2018-02-27 NOTE — ED Triage Notes (Signed)
Pt reports severe pain in left leg below the left buttock and going down left leg. IT began a this evening and is severe.

## 2018-02-28 NOTE — Discharge Instructions (Addendum)
Prednisone as prescribed.  Stop taking tramadol.  Begin taking hydrocodone is prescribed tonight as needed for pain.  Follow-up with your primary doctor if symptoms are not improving in the next week, and return to the ER if symptoms significantly worsen or change.

## 2019-03-06 IMAGING — CT CT CERVICAL SPINE W/O CM
3 of 7 series · 12 of 33 positions shown, 13 images · non-contrast
Comparison: None.

Head CT 11/22/2010, radiograph report of the cervical spine from
08/29/1995

CLINICAL DATA: Patient fell backwards hitting posterior head on
wall.

EXAM:
CT HEAD WITHOUT CONTRAST
CT CERVICAL SPINE WITHOUT CONTRAST
TECHNIQUE: Multidetector CT imaging of the head and cervical spine was
performed following the standard protocol without intravenous
contrast. Multiplanar CT image reconstructions of the cervical spine
were also generated.

[Series 4: head 3.0 mpr cor · coronal · 0.31mm/px · 3 of 68 slices shown]
[im 17/68  bone]
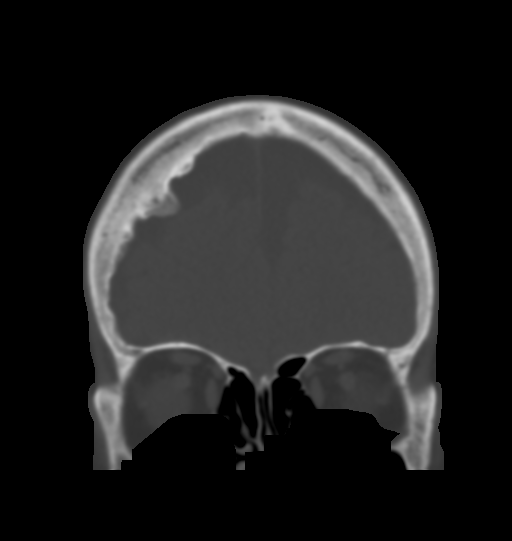
[im 34/68  bone]
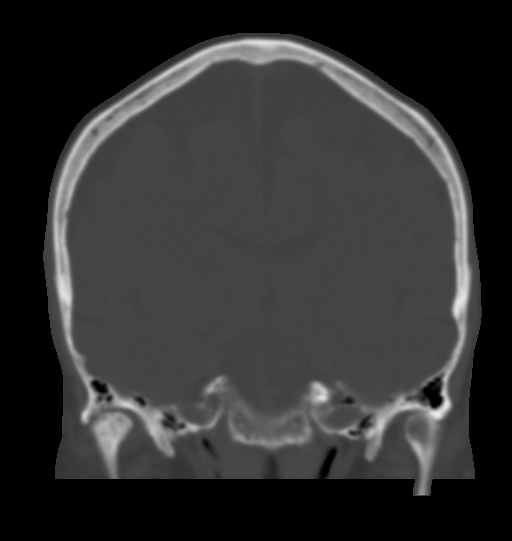
[im 51/68  bone]
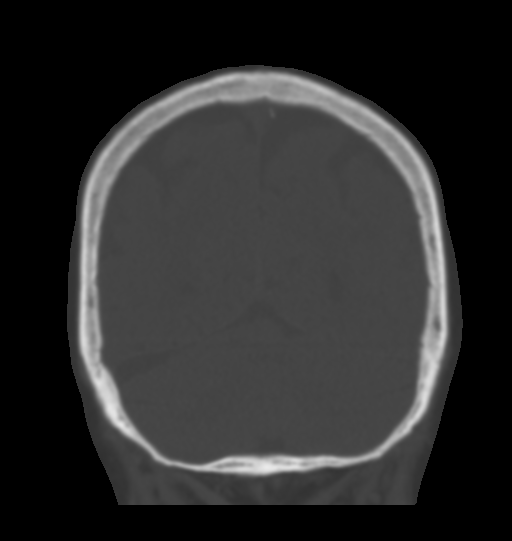

[Series 10: sagittals · sagittal · 0.20mm/px · 5 of 64 slices shown]
[im 11/64  bone]
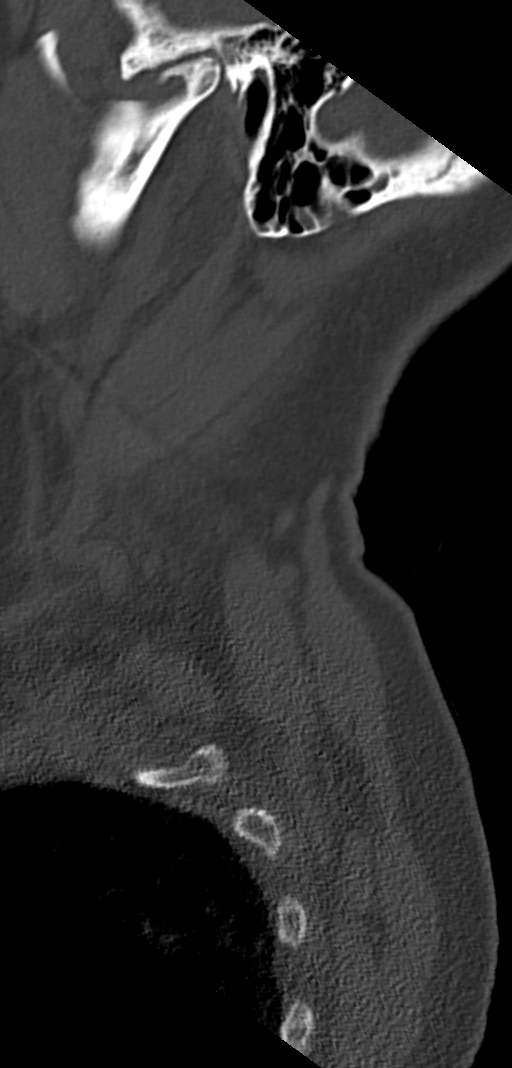
[im 22/64  bone]
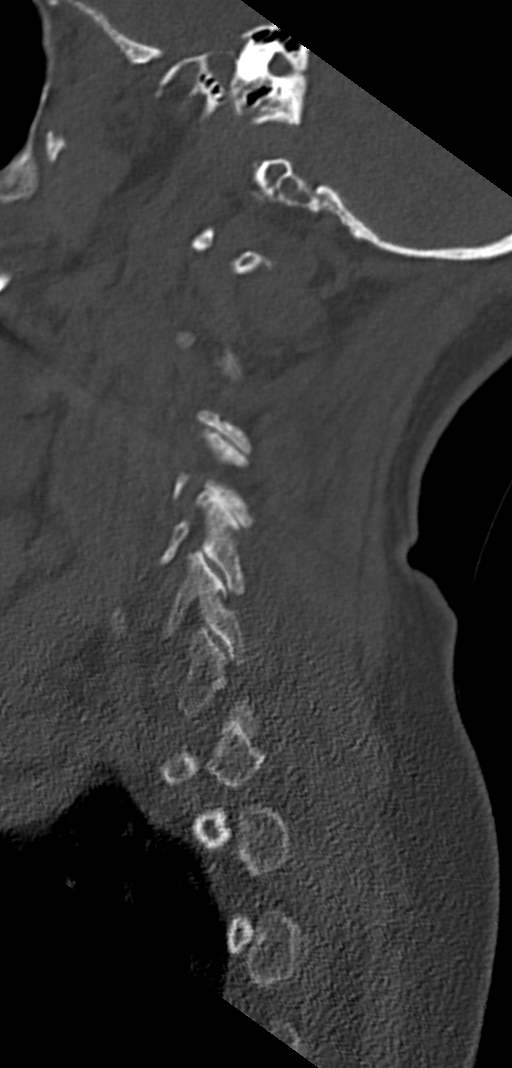
[im 32/64  bone]
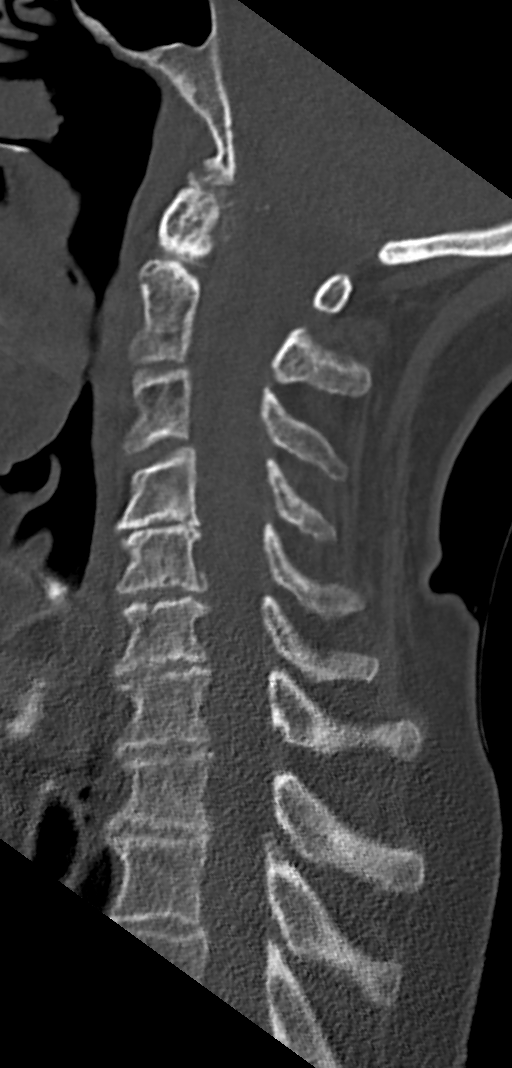
[im 43/64  bone]
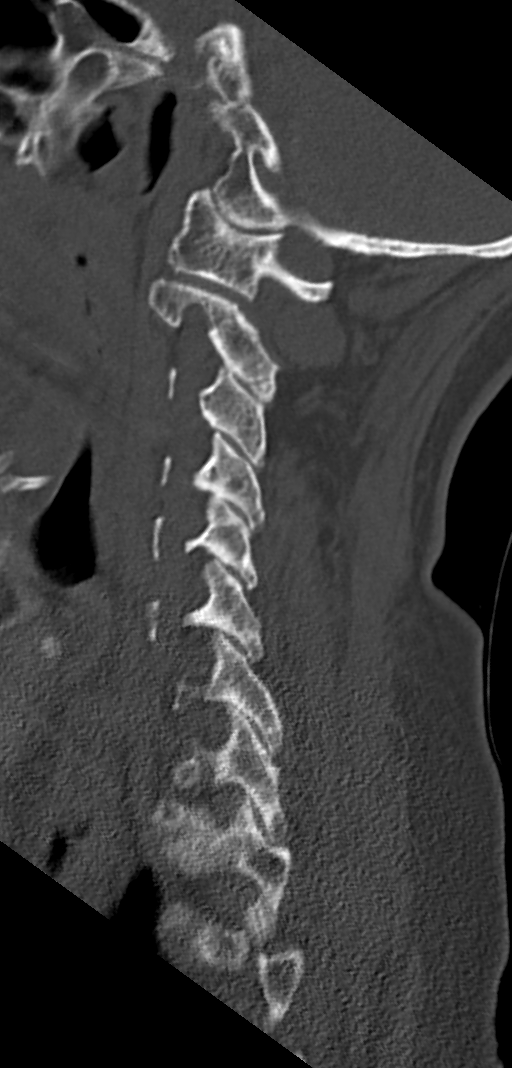
[im 53/64  bone]
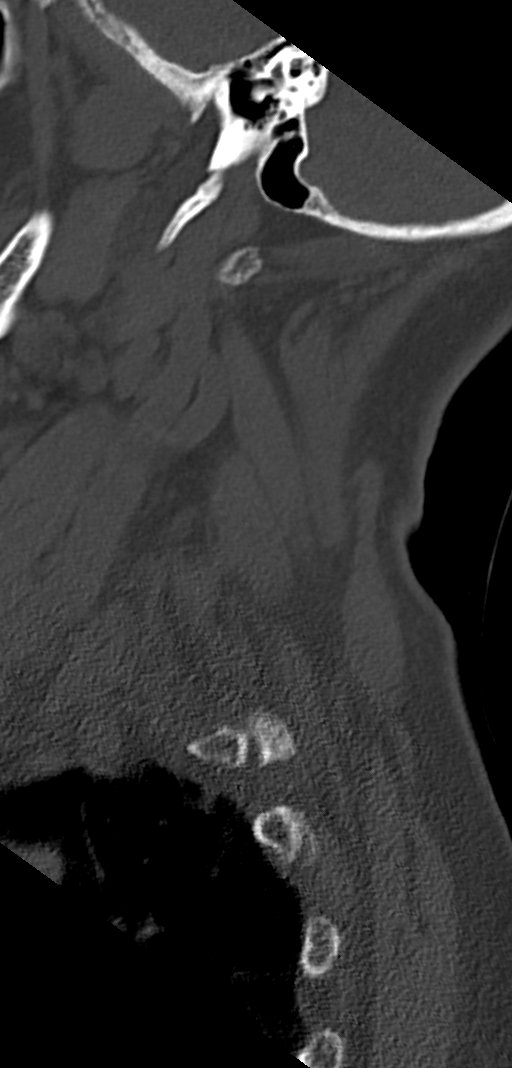

[Series 11: orthogonals · axial · 0.23mm/px · z∈[+587,+702]mm · 4 of 103 slices shown, 5 images]
[im 18/103  soft-tissue]
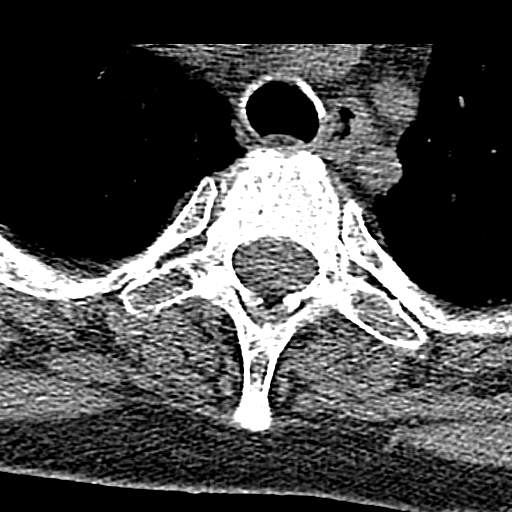
[im 18/103  bone]
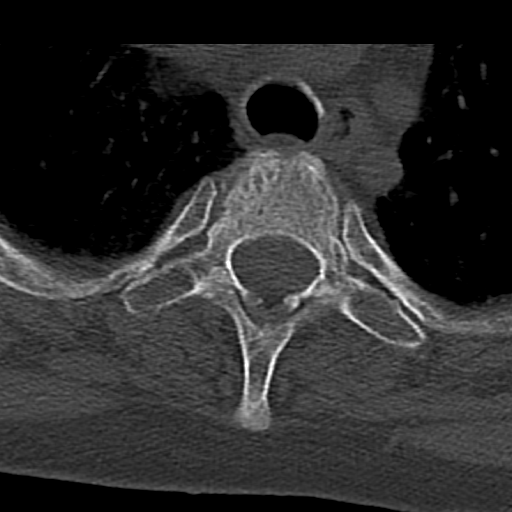
[im 35/103  bone]
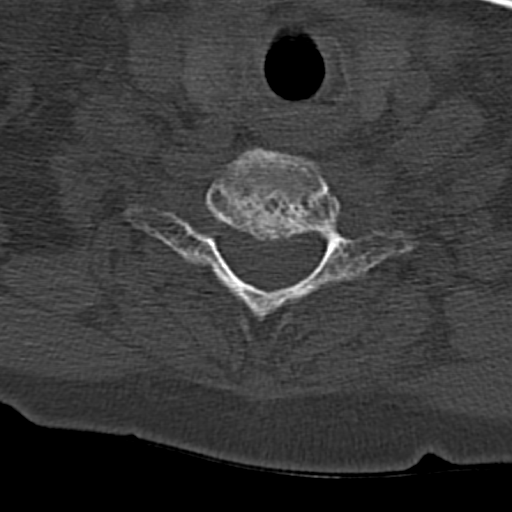
[im 69/103  bone]
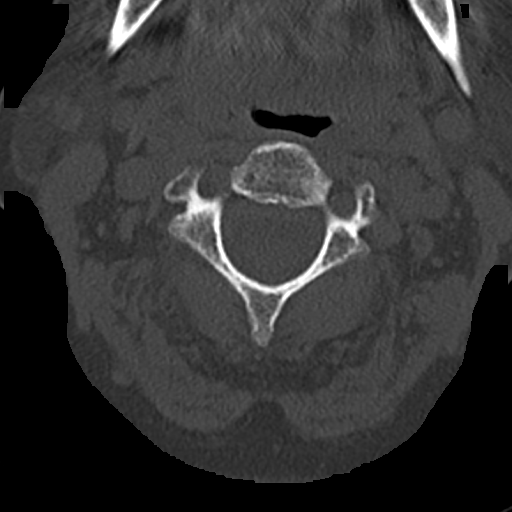
[im 86/103  bone]
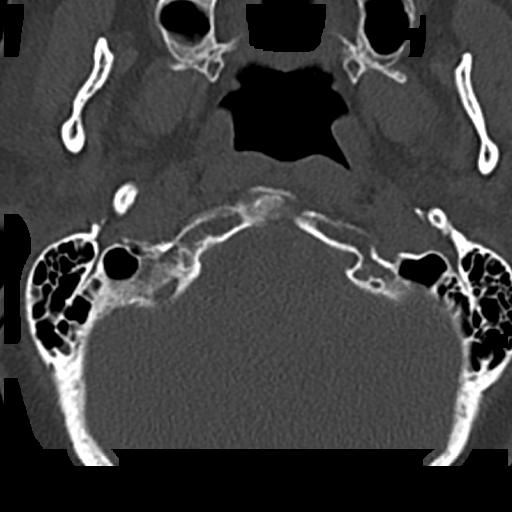

[12 of 33 positions shown; findings below may reference images not displayed]

FINDINGS: CT HEAD FINDINGS

Brain: Atrophy with chronic small vessel ischemic disease. No acute
intracranial hemorrhage, midline shift or edema. No hydrocephalus.
Midline fourth ventricle and basal cisterns. No intra-axial mass nor
extra-axial fluid collections.

Vascular: No hyperdense vessel sign. Moderate atherosclerosis of the
carotid siphons bilaterally.

Skull: No acute skull fracture.

Sinuses/Orbits: No acute finding.

Other: Right posterior parietal scalp contusion.

CT CERVICAL SPINE FINDINGS

Alignment: Intact craniocervical relationship and atlantodental
interval. Maintained cervical lordosis.

Skull base and vertebrae: No acute fracture. No primary bone lesion
or focal pathologic process. Osteoarthritis across the atlantodental
interval with spurring. Osteoarthritis of the C4-5, C5-6 and C6-7
uncovertebral joints with minimal uncinate spurring.

Soft tissues and spinal canal: No prevertebral fluid or swelling. No
visible canal hematoma.

Disc levels: Moderate disc space narrowing from C4 through T2 with
small dorsal osteophytes from C4 through C7. No significant central
canal stenosis or neural foraminal encroachment.

Upper chest: Negative.

Other: None
IMPRESSION: 1. Right posterior parietal scalp contusion without underlying skull
fracture.
2. Atrophy with chronic small vessel ischemia. No acute intracranial
abnormality.
3. No acute cervical spine fracture or posttraumatic listhesis.
Chronic degenerative disc disease C4 through T2.
